# Patient Record
Sex: Male | Born: 1970 | Race: White | Hispanic: No | Marital: Married | Smoking: Never smoker
Health system: Southern US, Community
[De-identification: ages and names within clinical notes are randomized; demographics above are authoritative.]

## PROBLEM LIST (undated history)

## (undated) HISTORY — PX: DG THUMB RIGHT HAND (ARMC HX): HXRAD1825

## (undated) HISTORY — PX: KNEE ARTHROSCOPY W/ ACL RECONSTRUCTION: SHX1858

## (undated) HISTORY — PX: APPENDECTOMY: SHX54

## (undated) HISTORY — PX: BACK SURGERY: SHX140

## (undated) HISTORY — PX: OTHER SURGICAL HISTORY: SHX169

---

## 2003-05-14 ENCOUNTER — Emergency Department (HOSPITAL_COMMUNITY): Admission: EM | Admit: 2003-05-14 | Discharge: 2003-05-14 | Payer: Self-pay | Admitting: Emergency Medicine

## 2005-09-19 ENCOUNTER — Ambulatory Visit: Payer: Self-pay | Admitting: Internal Medicine

## 2007-08-11 ENCOUNTER — Encounter: Admission: RE | Admit: 2007-08-11 | Discharge: 2007-08-11 | Payer: Self-pay | Admitting: Neurosurgery

## 2014-07-20 HISTORY — PX: COLONOSCOPY: SHX174

## 2018-07-04 DIAGNOSIS — G629 Polyneuropathy, unspecified: Secondary | ICD-10-CM | POA: Insufficient documentation

## 2018-09-02 ENCOUNTER — Other Ambulatory Visit: Payer: Self-pay

## 2018-09-02 ENCOUNTER — Other Ambulatory Visit: Payer: Self-pay | Admitting: Internal Medicine

## 2018-09-02 DIAGNOSIS — Z20822 Contact with and (suspected) exposure to covid-19: Secondary | ICD-10-CM

## 2018-09-07 LAB — NOVEL CORONAVIRUS, NAA: SARS-CoV-2, NAA: NOT DETECTED

## 2018-11-29 DIAGNOSIS — M722 Plantar fascial fibromatosis: Secondary | ICD-10-CM | POA: Insufficient documentation

## 2019-02-12 DIAGNOSIS — G609 Hereditary and idiopathic neuropathy, unspecified: Secondary | ICD-10-CM | POA: Insufficient documentation

## 2019-07-17 ENCOUNTER — Encounter: Payer: Self-pay | Admitting: Family Medicine

## 2020-05-27 ENCOUNTER — Telehealth: Payer: Self-pay | Admitting: Internal Medicine

## 2020-05-27 NOTE — Telephone Encounter (Signed)
Good morning Dr. Hilarie Fredrickson,   We received records from following patient whom would like to be scheduled for a colonoscopy. I will sent the records for you to review. Please advise.   Thank you ,  Smitty Cords

## 2020-05-31 NOTE — Telephone Encounter (Signed)
I received records regarding a colonoscopy. I did not receive the official report but rather the "colonoscopy discharge instructions" that were probably given directly to the patient. I need to see the full colonoscopy report with an impression and plan before I can make a decision regarding repeating colonoscopy.  I am happy to perform the colonoscopy once I have reviewed the records completely to determine when in fact the procedure is due. Also please determine if the patient has a family history of colon cancer

## 2020-06-02 NOTE — Telephone Encounter (Signed)
Left voicemail to patient advising to give a call back to inform him of needing additional information.   Thank you

## 2020-06-08 NOTE — Telephone Encounter (Signed)
Hey Dr Hilarie Fredrickson, we received colonoscopy records, I will send to you for review

## 2020-06-14 NOTE — Telephone Encounter (Signed)
I received the colonoscopy report performed by Dr. Shirleen Schirmer on 07/20/2014 This showed moderate diverticulosis in the sigmoid and internal hemorrhoids  He recommended a repeat colonoscopy in 1 year with 2-day prep given prep quality and risk factors  Colonoscopy can be scheduled, 2-day bowel prep

## 2020-06-24 NOTE — Telephone Encounter (Signed)
Left voicemail with patient to give a call back to schedule appointment.

## 2020-07-19 ENCOUNTER — Other Ambulatory Visit: Payer: Self-pay

## 2020-07-19 ENCOUNTER — Ambulatory Visit (AMBULATORY_SURGERY_CENTER): Payer: BC Managed Care – PPO | Admitting: *Deleted

## 2020-07-19 VITALS — Ht 77.0 in | Wt 265.0 lb

## 2020-07-19 DIAGNOSIS — Z1211 Encounter for screening for malignant neoplasm of colon: Secondary | ICD-10-CM

## 2020-07-19 MED ORDER — NA SULFATE-K SULFATE-MG SULF 17.5-3.13-1.6 GM/177ML PO SOLN: ORAL | 0 refills | Status: DC

## 2020-07-19 NOTE — Progress Notes (Signed)

## 2020-07-23 ENCOUNTER — Encounter: Payer: Self-pay | Admitting: Internal Medicine

## 2020-08-03 ENCOUNTER — Ambulatory Visit (AMBULATORY_SURGERY_CENTER): Payer: BC Managed Care – PPO | Admitting: Internal Medicine

## 2020-08-03 ENCOUNTER — Encounter: Payer: Self-pay | Admitting: Internal Medicine

## 2020-08-03 ENCOUNTER — Other Ambulatory Visit: Payer: Self-pay

## 2020-08-03 VITALS — BP 141/93 | HR 61 | Temp 98.6°F | Resp 26 | Ht 77.0 in | Wt 265.0 lb

## 2020-08-03 DIAGNOSIS — Z1211 Encounter for screening for malignant neoplasm of colon: Secondary | ICD-10-CM | POA: Diagnosis not present

## 2020-08-03 DIAGNOSIS — K635 Polyp of colon: Secondary | ICD-10-CM

## 2020-08-03 DIAGNOSIS — D123 Benign neoplasm of transverse colon: Secondary | ICD-10-CM | POA: Diagnosis not present

## 2020-08-03 DIAGNOSIS — Z8371 Family history of colonic polyps: Secondary | ICD-10-CM

## 2020-08-03 DIAGNOSIS — Z800 Family history of malignant neoplasm of digestive organs: Secondary | ICD-10-CM

## 2020-08-03 DIAGNOSIS — D120 Benign neoplasm of cecum: Secondary | ICD-10-CM

## 2020-08-03 DIAGNOSIS — Z83719 Family history of colon polyps, unspecified: Secondary | ICD-10-CM

## 2020-08-03 MED ORDER — SODIUM CHLORIDE 0.9 % IV SOLN: 500.0000 mL | Freq: Once | INTRAVENOUS | Status: DC

## 2020-08-03 NOTE — Progress Notes (Signed)
Called to room to assist during endoscopic procedure.  Patient ID and intended procedure confirmed with present staff. Received instructions for my participation in the procedure from the performing physician.  

## 2020-08-03 NOTE — Patient Instructions (Signed)
Handouts on polyps, hemorrhoids, and diverticula given to you today  Await pathology results    YOU HAD AN ENDOSCOPIC PROCEDURE TODAY AT [facility]:   Refer to the procedure report that was given to you for any specific questions about what was found during the examination.  If the procedure report does not answer your questions, please call your gastroenterologist to clarify.  If you requested that your care partner not be given the details of your procedure findings, then the procedure report has been included in a sealed envelope for you to review at your convenience later.  YOU SHOULD EXPECT: Some feelings of bloating in the abdomen. Passage of more gas than usual.  Walking can help get rid of the air that was put into your GI tract during the procedure and reduce the bloating. If you had a lower endoscopy (such as a colonoscopy or flexible sigmoidoscopy) you may notice spotting of blood in your stool or on the toilet paper. If you underwent a bowel prep for your procedure, you may not have a normal bowel movement for a few days.  Please Note:  You might notice some irritation and congestion in your nose or some drainage.  This is from the oxygen used during your procedure.  There is no need for concern and it should clear up in a day or so.  SYMPTOMS TO REPORT IMMEDIATELY:   Following lower endoscopy (colonoscopy or flexible sigmoidoscopy):  Excessive amounts of blood in the stool  Significant tenderness or worsening of abdominal pains  Swelling of the abdomen that is new, acute  Fever of 100F or higher  For urgent or emergent issues, a gastroenterologist can be reached at any hour by calling [phone]. Do not use MyChart messaging for urgent concerns.    DIET:  We do recommend a small meal at first, but then you may proceed to your regular diet.  Drink plenty of fluids but you should avoid alcoholic beverages for 24 hours.  ACTIVITY:  You should plan to take it  easy for the rest of today and you should NOT DRIVE or use heavy machinery until tomorrow (because of the sedation medicines used during the test).    FOLLOW UP: Our staff will call the number listed on your records 48-72 hours following your procedure to check on you and address any questions or concerns that you may have regarding the information given to you following your procedure. If we do not reach you, we will leave a message.  We will attempt to reach you two times.  During this call, we will ask if you have developed any symptoms of COVID 19. If you develop any symptoms (ie: fever, flu-like symptoms, shortness of breath, cough etc.) before then, please call [phone].  If you test positive for Covid 19 in the 2 weeks post procedure, please call and report this information to [place].    If any biopsies were taken you will be contacted by phone or by letter within the next 1-3 weeks.  Please call us at [phone] if you have not heard about the biopsies in 3 weeks.    SIGNATURES/CONFIDENTIALITY: You and/or your care partner have signed paperwork which will be entered into your electronic medical record.  These signatures attest to the fact that that the information above on your After Visit Summary has been reviewed and is understood.  Full responsibility of the confidentiality of this discharge information lies with you and/or your care-partner.

## 2020-08-03 NOTE — Progress Notes (Signed)
pt tolerated well. VSS. awake and to recovery. Report given to RN.  

## 2020-08-03 NOTE — Op Note (Signed)
[facility] Patient Name: Philip Sherman Procedure Date: 08/03/2020 7:50 AM MRN: [id number] Endoscopist: Jerene Bears , MD Age: 50 Referring MD:  Date of Birth: [birth year] Gender: Male Account #: [id number] Procedure:                Colonoscopy Indications:              Colon cancer screening in patient at increased                            risk: Family history of colon polyps in multiple                            1st-degree relatives (brother and father), Colon                            cancer screening in patient at increased risk:                            Family history of colorectal cancer in multiple 2nd                            degree relatives (paternal grandmother and                            grandfather), Last colonoscopy: 2016 (less than                            adequate prep on that day) Medicines:                Monitored Anesthesia Care Procedure:                Pre-Anesthesia Assessment:                           - Prior to the procedure, a History and Physical                            was performed, and patient medications and                            allergies were reviewed. The patient's tolerance of                            previous anesthesia was also reviewed. The risks                            and benefits of the procedure and the sedation                            options and risks were discussed with the patient.                            All questions were answered, and informed consent  was obtained. Prior Anticoagulants: The patient has                            taken no previous anticoagulant or antiplatelet                            agents. ASA Grade Assessment: II - A patient with                            mild systemic disease. After reviewing the risks                            and benefits, the patient was deemed in                            satisfactory condition to undergo the  procedure.                           After obtaining informed consent, the colonoscope                            was passed under direct vision. Throughout the                            procedure, the patient's blood pressure, pulse, and                            oxygen saturations were monitored continuously. The                            Olympus CF-HQ190L ([id number]) Colonoscope was                            introduced through the anus and advanced to the                            cecum, identified by its appearance. The                            colonoscopy was performed without difficulty. The                            patient tolerated the procedure well. The quality                            of the bowel preparation was good. The ileocecal                            valve, appendiceal orifice, and rectum were                            photographed. The bowel preparation used was 2 day  MiraLax + Suprep via split dose instruction. Scope In: 8:11:35 AM Scope Out: 8:29:37 AM Scope Withdrawal Time: 0 hours 12 minutes 40 seconds  Total Procedure Duration: 0 hours 18 minutes 2 seconds  Findings:                 The digital rectal exam was normal.                           A 3 mm polyp was found in the cecum. The polyp was                            sessile. The polyp was removed with a cold snare.                            Resection and retrieval were complete.                           Two sessile polyps were found in the transverse                            colon. The polyps were 5 to 6 mm in size. These                            polyps were removed with a cold snare. Resection                            and retrieval were complete.                           Many small and large-mouthed diverticula were found                            from transverse colon to sigmoid colon.                           Internal hemorrhoids were found during                             retroflexion. The hemorrhoids were small. Complications:            No immediate complications. Estimated Blood Loss:     Estimated blood loss was minimal. Impression:               - One 3 mm polyp in the cecum, removed with a cold                            snare. Resected and retrieved.                           - Two 5 to 6 mm polyps in the transverse colon,                            removed with a cold snare. Resected and retrieved.                           -  Diverticulosis from transverse colon to sigmoid                            colon.                           - Small internal hemorrhoids. Recommendation:           - Patient has a contact number available for                            emergencies. The signs and symptoms of potential                            delayed complications were discussed with the                            patient. Return to normal activities tomorrow.                            Written discharge instructions were provided to the                            patient.                           - Resume previous diet.                           - Continue present medications.                           - Await pathology results.                           - Repeat colonoscopy is recommended for                            surveillance. The colonoscopy date will be                            determined after pathology results from today's                            exam become available for review. Jerene Bears, MD 08/03/2020 8:34:02 AM This report has been signed electronically.

## 2020-08-03 NOTE — Progress Notes (Signed)
Vital signs checked by:JK  The patient states no changes in medical or surgical history since pre-visit screening on  07/19/20.

## 2020-08-04 ENCOUNTER — Encounter: Payer: Self-pay | Admitting: Internal Medicine

## 2020-08-05 ENCOUNTER — Telehealth: Payer: Self-pay

## 2020-08-05 ENCOUNTER — Telehealth: Payer: Self-pay | Admitting: *Deleted

## 2020-08-05 NOTE — Telephone Encounter (Signed)
First follow up call attempt.  LVM. 

## 2020-08-05 NOTE — Telephone Encounter (Signed)
Attempted to reach pt. With follow-up call following endoscopic procedure 08/03/2020.  LM on pt. Voice mail to call if he has any questions or concerns.

## 2020-08-07 ENCOUNTER — Encounter: Payer: Self-pay | Admitting: Internal Medicine

## 2020-08-18 ENCOUNTER — Other Ambulatory Visit: Payer: Self-pay | Admitting: Orthopedic Surgery

## 2020-08-18 DIAGNOSIS — M5416 Radiculopathy, lumbar region: Secondary | ICD-10-CM

## 2020-08-18 DIAGNOSIS — M4726 Other spondylosis with radiculopathy, lumbar region: Secondary | ICD-10-CM

## 2020-08-18 DIAGNOSIS — M48061 Spinal stenosis, lumbar region without neurogenic claudication: Secondary | ICD-10-CM

## 2020-08-18 DIAGNOSIS — Z981 Arthrodesis status: Secondary | ICD-10-CM

## 2020-08-18 DIAGNOSIS — M5136 Other intervertebral disc degeneration, lumbar region: Secondary | ICD-10-CM

## 2020-08-18 DIAGNOSIS — M532X6 Spinal instabilities, lumbar region: Secondary | ICD-10-CM

## 2020-08-18 DIAGNOSIS — M5386 Other specified dorsopathies, lumbar region: Secondary | ICD-10-CM

## 2020-08-18 DIAGNOSIS — M4807 Spinal stenosis, lumbosacral region: Secondary | ICD-10-CM

## 2020-09-07 ENCOUNTER — Ambulatory Visit
Admission: RE | Admit: 2020-09-07 | Discharge: 2020-09-07 | Disposition: A | Discharge status: Home / Self Care | Payer: Worker's Compensation | Source: Ambulatory Visit | Attending: Orthopedic Surgery | Admitting: Orthopedic Surgery

## 2020-09-07 DIAGNOSIS — M532X6 Spinal instabilities, lumbar region: Secondary | ICD-10-CM

## 2020-09-07 DIAGNOSIS — Z981 Arthrodesis status: Secondary | ICD-10-CM

## 2020-09-07 DIAGNOSIS — M4807 Spinal stenosis, lumbosacral region: Secondary | ICD-10-CM

## 2020-09-07 DIAGNOSIS — M5416 Radiculopathy, lumbar region: Secondary | ICD-10-CM

## 2020-09-07 DIAGNOSIS — M5386 Other specified dorsopathies, lumbar region: Secondary | ICD-10-CM

## 2020-09-07 DIAGNOSIS — M4726 Other spondylosis with radiculopathy, lumbar region: Secondary | ICD-10-CM

## 2020-09-07 DIAGNOSIS — M51369 Other intervertebral disc degeneration, lumbar region without mention of lumbar back pain or lower extremity pain: Secondary | ICD-10-CM

## 2020-09-07 DIAGNOSIS — M5136 Other intervertebral disc degeneration, lumbar region: Secondary | ICD-10-CM

## 2020-09-07 DIAGNOSIS — M48061 Spinal stenosis, lumbar region without neurogenic claudication: Secondary | ICD-10-CM

## 2020-09-07 MED ORDER — GADOBENATE DIMEGLUMINE 529 MG/ML IV SOLN
20.0000 mL | Freq: Once | INTRAVENOUS | Status: AC | PRN
  Administered 2020-09-07: 20 mL via INTRAVENOUS

## 2020-11-26 DIAGNOSIS — M51369 Other intervertebral disc degeneration, lumbar region without mention of lumbar back pain or lower extremity pain: Secondary | ICD-10-CM | POA: Insufficient documentation

## 2021-01-01 DIAGNOSIS — E785 Hyperlipidemia, unspecified: Secondary | ICD-10-CM | POA: Insufficient documentation

## 2022-02-06 DIAGNOSIS — Y9361 Activity, american tackle football: Secondary | ICD-10-CM | POA: Insufficient documentation

## 2022-02-06 DIAGNOSIS — Z87820 Personal history of traumatic brain injury: Secondary | ICD-10-CM | POA: Insufficient documentation

## 2022-02-09 DIAGNOSIS — G2581 Restless legs syndrome: Secondary | ICD-10-CM | POA: Insufficient documentation

## 2022-02-09 DIAGNOSIS — R030 Elevated blood-pressure reading, without diagnosis of hypertension: Secondary | ICD-10-CM | POA: Insufficient documentation

## 2022-02-09 DIAGNOSIS — R7989 Other specified abnormal findings of blood chemistry: Secondary | ICD-10-CM | POA: Insufficient documentation

## 2022-02-09 DIAGNOSIS — I2510 Atherosclerotic heart disease of native coronary artery without angina pectoris: Secondary | ICD-10-CM | POA: Insufficient documentation

## 2022-02-09 DIAGNOSIS — F411 Generalized anxiety disorder: Secondary | ICD-10-CM | POA: Insufficient documentation

## 2022-02-09 DIAGNOSIS — R768 Other specified abnormal immunological findings in serum: Secondary | ICD-10-CM | POA: Insufficient documentation

## 2022-02-16 DIAGNOSIS — I10 Essential (primary) hypertension: Secondary | ICD-10-CM | POA: Insufficient documentation

## 2022-05-12 IMAGING — MR MR LUMBAR SPINE WO/W CM
4 of 8 series · 26 of 48 positions shown · IV contrast (multihance)
Comparison: 08/11/2007

CLINICAL DATA: Toe numbness.  Lumbar fusion in 4761

EXAM:
MRI LUMBAR SPINE WITHOUT AND WITH CONTRAST
TECHNIQUE: Multiplanar and multiecho pulse sequences of the lumbar spine were
obtained without and with intravenous contrast.
CONTRAST:  20mL MULTIHANCE GADOBENATE DIMEGLUMINE 529 MG/ML IV SOLN

[Series 4: T2 · sagittal · 4.0mm · 0.57mm/px · 5 of 16 slices shown (1 of 2)]
[im 1/16]
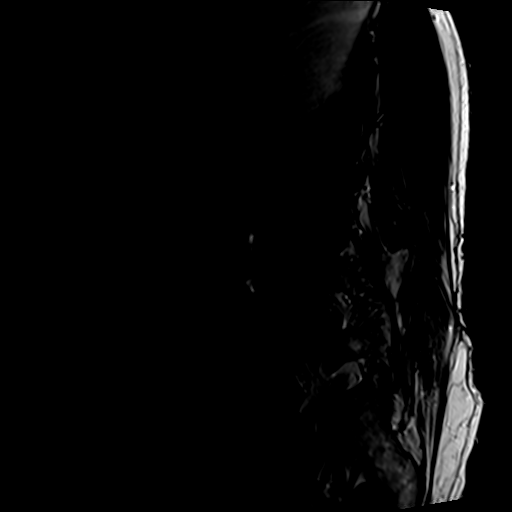
[im 4/16]
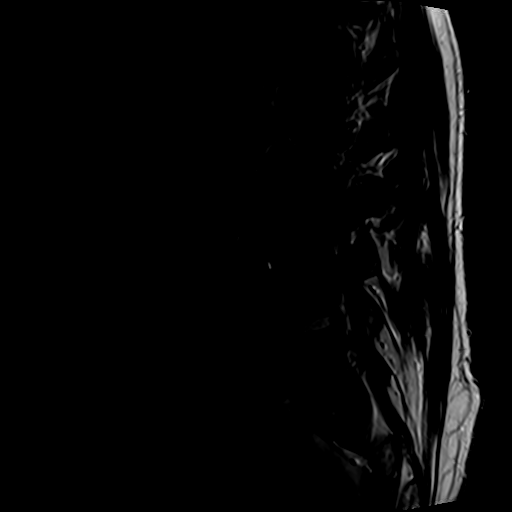
[im 8/16]
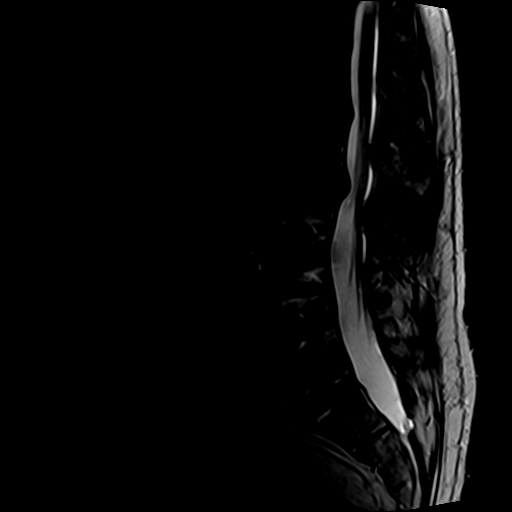
[im 12/16]
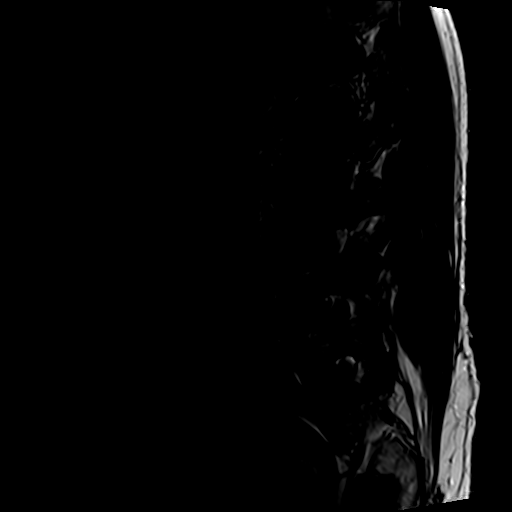
[im 16/16]
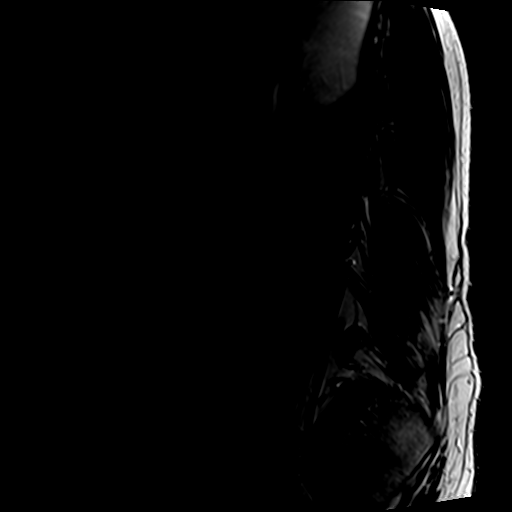

[Series 6: T1 · sagittal · 4.0mm · 0.57mm/px · 4 of 16 slices shown (1 of 2)]
[im 1/16]
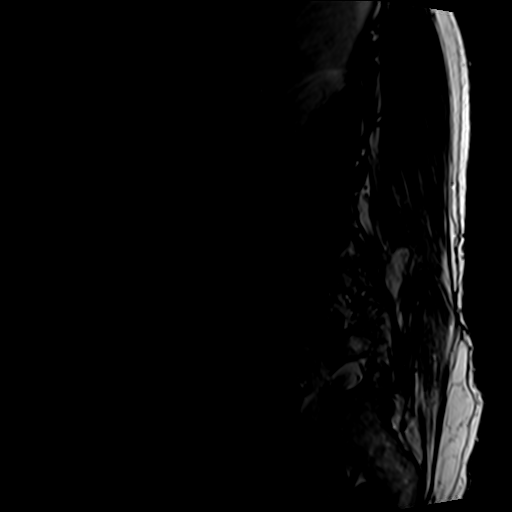
[im 6/16]
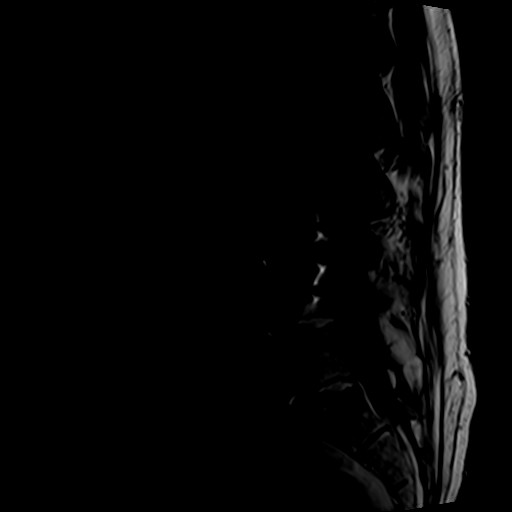
[im 11/16]
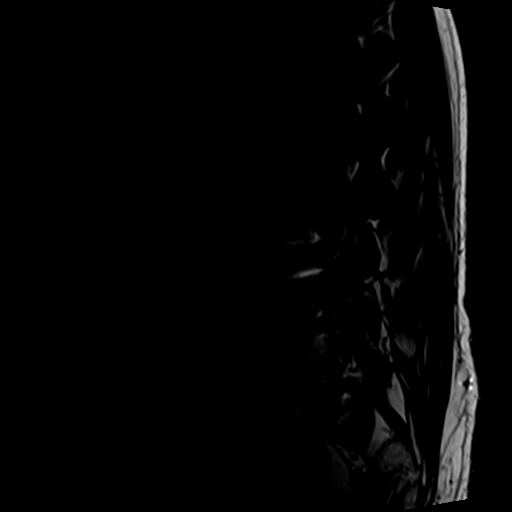
[im 16/16]
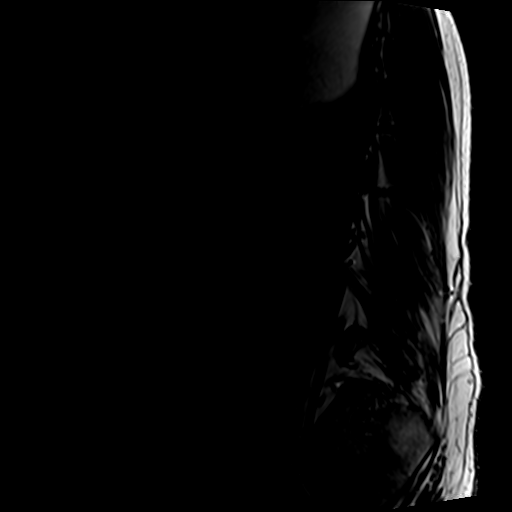

[Series 7: T2 · axial · 4.0mm · 0.70mm/px · z∈[+10,+220]mm · 9 of 35 slices shown (2 of 2)]
[im 1/35]
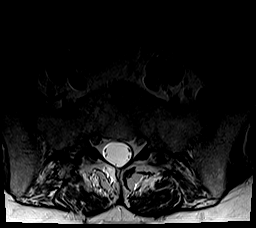
[im 5/35]
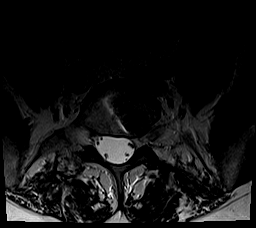
[im 9/35]
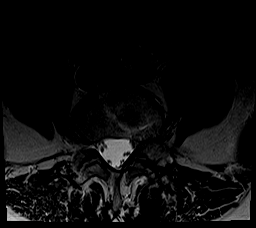
[im 13/35]
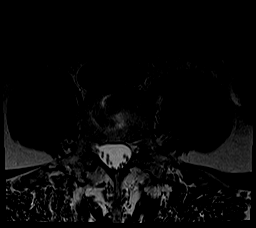
[im 18/35]
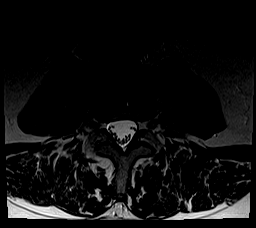
[im 22/35]
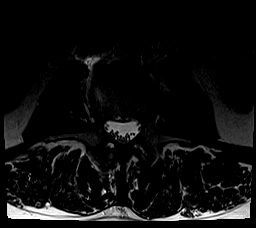
[im 26/35]
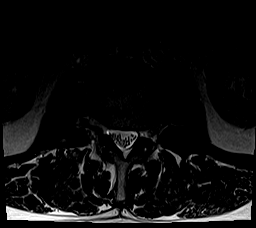
[im 30/35]
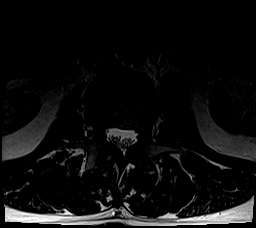
[im 35/35]
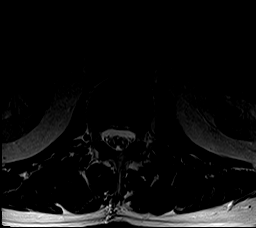

[Series 8: T1 · axial · 4.0mm · 0.35mm/px · z∈[+10,+194]mm · 8 of 35 slices shown (2 of 2)]
[im 1/35]
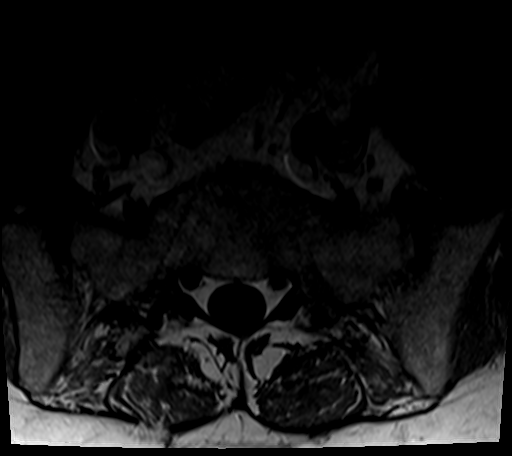
[im 5/35]
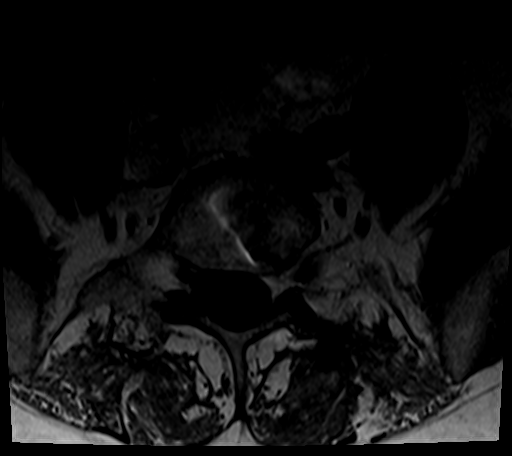
[im 9/35]
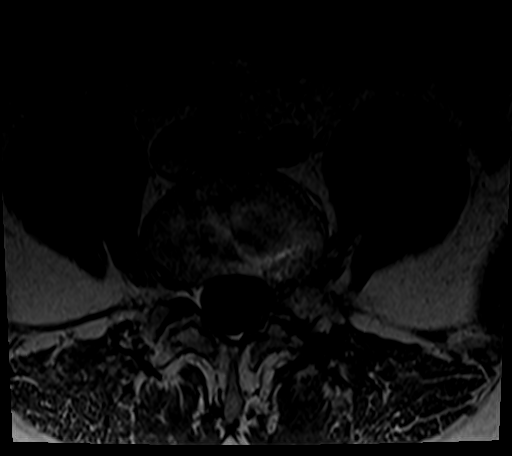
[im 13/35]
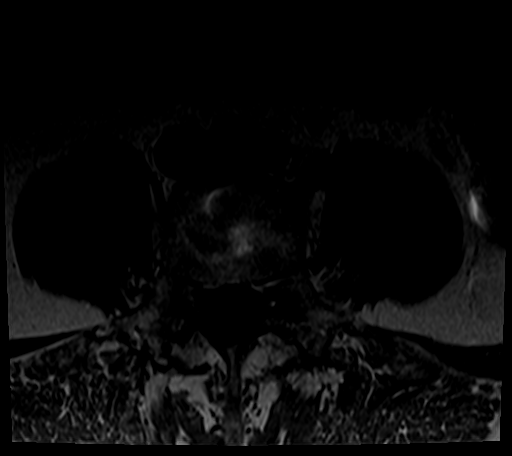
[im 18/35]
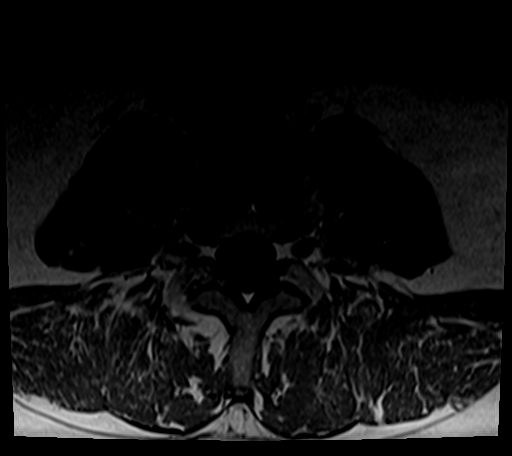
[im 22/35]
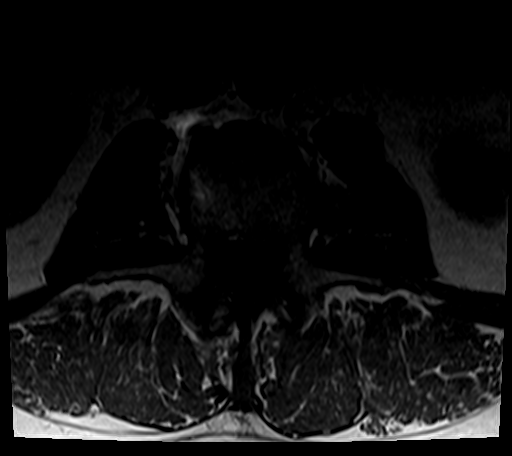
[im 26/35]
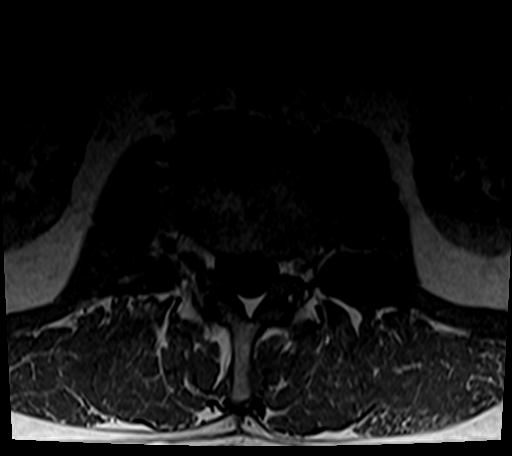
[im 30/35]
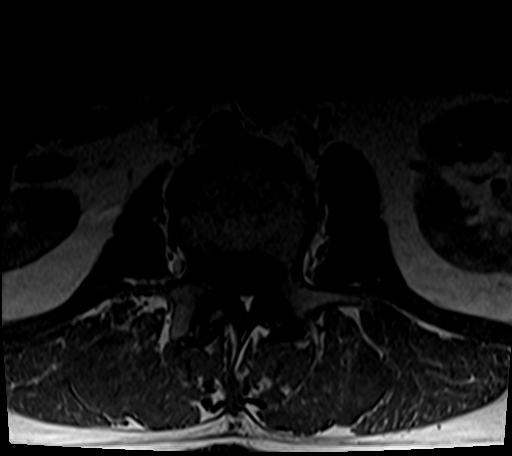

[26 of 48 positions shown; findings below may reference images not displayed]

FINDINGS: Segmentation:  Normal

Alignment:  Grade 1 retrolisthesis at L2-3, progressed.

Vertebrae: L3-4 ALIF. Interbody spacers at L4-5 and L5-S1. No acute
fracture. No focal osseous lesion.

Conus medullaris and cauda equina: Conus extends to the L1 level.
Conus and cauda equina appear normal.

Paraspinal and other soft tissues: Negative

Disc levels:

L1-L2: Normal disc space and facet joints. No spinal canal stenosis.
No neural foraminal stenosis.

L2-L3: Small disc bulge with endplate spurring, new. No spinal canal
stenosis. No neural foraminal stenosis.

L3-L4: ALIF. No spinal canal stenosis. No neural foraminal stenosis.

L4-L5: Interbody disc spacer. Normal facets. No spinal canal
stenosis. Left foraminal endplate spurring with mild left neural
foraminal stenosis.

L5-S1: Interbody spacer. Progression of moderate right and severe
left facet hypertrophy. No spinal canal stenosis. No neural
foraminal stenosis.

Visualized sacrum: Normal.
IMPRESSION: 1. Progression of facet arthrosis at L5-S1, which may be a source of
local low back pain.
2. L3-4 ALIF without spinal canal stenosis.
3. Mild left neural foraminal stenosis at L4-5.

## 2022-07-04 DIAGNOSIS — E669 Obesity, unspecified: Secondary | ICD-10-CM | POA: Insufficient documentation

## 2022-08-21 ENCOUNTER — Telehealth: Payer: Self-pay | Admitting: Family Medicine

## 2022-08-21 NOTE — Telephone Encounter (Signed)
Patient would like to establish care with Dr.Greene. States he has already talked to the provider and the provider made him aware to call the office to get consent. Please advise.

## 2022-08-21 NOTE — Telephone Encounter (Signed)
Please schedule patient.

## 2022-08-21 NOTE — Telephone Encounter (Signed)
Yes, okay to establish new patient appointment with me.  Happy to see him.  Thanks.

## 2022-08-25 ENCOUNTER — Encounter: Payer: Self-pay | Admitting: Family Medicine

## 2022-08-25 ENCOUNTER — Ambulatory Visit: Payer: BC Managed Care – PPO | Admitting: Family Medicine

## 2022-08-25 VITALS — BP 118/60 | HR 65 | Temp 97.8°F | Ht 77.0 in | Wt 260.0 lb

## 2022-08-25 DIAGNOSIS — Z131 Encounter for screening for diabetes mellitus: Secondary | ICD-10-CM | POA: Diagnosis not present

## 2022-08-25 DIAGNOSIS — G4700 Insomnia, unspecified: Secondary | ICD-10-CM

## 2022-08-25 DIAGNOSIS — Z23 Encounter for immunization: Secondary | ICD-10-CM

## 2022-08-25 DIAGNOSIS — E785 Hyperlipidemia, unspecified: Secondary | ICD-10-CM | POA: Diagnosis not present

## 2022-08-25 DIAGNOSIS — E291 Testicular hypofunction: Secondary | ICD-10-CM

## 2022-08-25 DIAGNOSIS — G629 Polyneuropathy, unspecified: Secondary | ICD-10-CM

## 2022-08-25 DIAGNOSIS — I10 Essential (primary) hypertension: Secondary | ICD-10-CM | POA: Diagnosis not present

## 2022-08-25 LAB — COMPREHENSIVE METABOLIC PANEL
ALT: 24 U/L (ref 0–53)
AST: 18 U/L (ref 0–37)
Albumin: 4.5 g/dL (ref 3.5–5.2)
Alkaline Phosphatase: 54 U/L (ref 39–117)
BUN: 25 mg/dL — ABNORMAL HIGH (ref 6–23)
CO2: 30 mEq/L (ref 19–32)
Calcium: 9.9 mg/dL (ref 8.4–10.5)
Chloride: 103 mEq/L (ref 96–112)
Creatinine, Ser: 0.92 mg/dL (ref 0.40–1.50)
GFR: 95.2 mL/min (ref >60.00)
Glucose, Bld: 77 mg/dL (ref 70–99)
Potassium: 5.1 mEq/L (ref 3.5–5.1)
Sodium: 141 mEq/L (ref 135–145)
Total Bilirubin: 0.7 mg/dL (ref 0.2–1.2)
Total Protein: 7.1 g/dL (ref 6.0–8.3)

## 2022-08-25 LAB — HEMOGLOBIN A1C: Hgb A1c MFr Bld: 5.7 % (ref 4.6–6.5)

## 2022-08-25 LAB — LIPID PANEL
Cholesterol: 141 mg/dL (ref 0–200)
HDL: 35.2 mg/dL — ABNORMAL LOW (ref >39.00)
LDL Cholesterol: 75 mg/dL (ref 0–99)
NonHDL: 105.99
Total CHOL/HDL Ratio: 4
Triglycerides: 153 mg/dL — ABNORMAL HIGH (ref 0.0–149.0)
VLDL: 30.6 mg/dL (ref 0.0–40.0)

## 2022-08-25 LAB — TESTOSTERONE: Testosterone: 169.98 ng/dL — ABNORMAL LOW (ref 300.00–890.00)

## 2022-08-25 LAB — CBC
HCT: 45.2 % (ref 39.0–52.0)
Hemoglobin: 15.1 g/dL (ref 13.0–17.0)
MCHC: 33.5 g/dL (ref 30.0–36.0)
MCV: 91.7 fl (ref 78.0–100.0)
Platelets: 239 10*3/uL (ref 150.0–400.0)
RBC: 4.93 Mil/uL (ref 4.22–5.81)
RDW: 12.9 % (ref 11.5–15.5)
WBC: 7.1 10*3/uL (ref 4.0–10.5)

## 2022-08-25 LAB — PSA: PSA: 0.51 ng/mL (ref 0.10–4.00)

## 2022-08-25 MED ORDER — ZOLPIDEM TARTRATE ER 6.25 MG PO TBCR
6.2500 mg | EXTENDED_RELEASE_TABLET | Freq: Every evening | ORAL | 0 refills | Status: DC | PRN
Start: 2022-08-25 — End: 2023-05-03

## 2022-08-25 NOTE — Progress Notes (Signed)
Subjective:  Patient ID: Philip Sherman, male    DOB: 06/25/1970  Age: 52 y.o. MRN: [id number]  CC:  Chief Complaint  Patient presents with   Establish Care    Pt here to establish care, notes he has a lot going on, was getting ready to start testosterone treatment before he changed physicians,     HPI Philip Sherman presents for  Philip Sherman is here for transfer of care, new patient to establish care.  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 Previously under the care of Dr. Cyndia Bent at [facility] - he has been promoted to more administrative role. Family may also transfer to me. Son Philip Sherman and Philip Sherman - both in [place]. Daughter graduated from [place], other dtr in doctorate program at [employer].    Hypertension, hyperlipidemia, history of abnormal EKG Cardiologist, Dr. Fransisco Beau, last visit May 7, history of abnormal EKG, hyperlipidemia, hypertension.  Initial EKG had abnormal progression of precordial R wave.  Stress echocardiogram in February with exercise for 10 minutes, 12.8 METS without ischemic changes.  Stress echo with appropriate reduction in LV cavity size with augmentation of LV cavity function, no regional wall motion abnormality.  Blood pressure and cholesterol were well-controlled and no cardiovascular symptoms.  Continued on Crestor 10 mg daily for hyperlipidemia, 10 mg lisinopril for hypertension. No new side effects  Father with heart disease, CABG - MI at early age - around 53.   BP Readings from Last 3 Encounters:  08/25/22 118/60  08/03/20 (!) 141/93  No results found for: "CHOL", "HDL", "LDLCALC", "LDLDIRECT", "TRIG", "CHOLHDL" Chart review, the panel 02/08/2022, total 172, HDL 42, LDL 102, triglycerides 142 Exercise limited due to foot issues.  Fasting today except  coffee with creamer.   Has taken [facility] for weight loss - no new side effects. Some decreased appetite. No n/v/abd pain.  Body mass index is 30.83 kg/m. Wt Readings from Last 3 Encounters:  08/25/22 260 lb (117.9 kg)  08/03/20 265 lb (120.2 kg)  07/19/20 265 lb (120.2 kg)  A1c 5.5 on 02/08/22.   Insomnia: History of insomnia treated with Ambien CR 6.25 mg, only as needed. Once every few weeks.Controlled substance database (PDMP) reviewed. No concerns appreciated.  Last filled #30, 11/09/21.   Neuropathy Ongoing issue, followed previously by [facility] Dr. Rulon Eisenmenger.  Note reviewed from January 26 -at that time had skin biopsy, diagnosed with small fiber sensory neuropathy.  treated with gabapentin 600 mg nightly -dosage increased on April 11.  Referred to [facility] neuropathy clinic.  Note reviewed from April 15 -E consult.  New EMG to evaluate for possible large fiber involvement.  Labs obtained including hepatology evaluation, with history of positive HCV antibody - HCV RNA not detected in 01/2022. 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  Option of foot orthotics.  Plan for a second opinion from [facility] soon - Working with [employer] on this as well - will be seeing in November, possible [facility] and will be seeing provider in [place]. Still on gabapentin 600mg  at night. No new side effects.    Hypogonadism Recently evaluated by his previous PCP.  Testosterone level of 138 at 7:46 AM on June 11th.  previously 148 with free testosterone 4.3 on April  24, drawn at 3:18 PM.  No current testosterone supplementation. Low energy levels. Multiple low testosterone levels prior - plan to start supplement at last pcp, but deferred due to new pcp plan. Labs today with follow up to discuss options in next few weeks.   HM:  Shingrix today.   History There are no problems to display for this patient.  History  reviewed. No pertinent past medical history. Past Surgical History:  Procedure Laterality Date   APPENDECTOMY     BACK SURGERY     x3   COLONOSCOPY  07/20/2014   Dr.Gillis    DG THUMB RIGHT HAND ([facility] HX)     index finger     KNEE ARTHROSCOPY W/ ACL RECONSTRUCTION     No Known Allergies Prior to Admission medications   Medication Sig Start Date End Date Taking? Authorizing Provider  gabapentin (NEURONTIN) 300 MG capsule Take 600 mg by mouth at bedtime. 02/28/22  Yes [provider]  lisinopril (ZESTRIL) 10 MG tablet Take 1 tablet by mouth daily. 02/10/22  Yes [provider]  Multiple Vitamin (MULTIVITAMIN) tablet Take 1 tablet by mouth daily.   Yes [provider]  rosuvastatin (CRESTOR) 10 MG tablet Take 10 mg by mouth daily. 11/09/21  Yes [provider]  zolpidem (AMBIEN CR) 6.25 MG CR tablet Take 6.25 mg by mouth at bedtime as needed. 05/24/20  Yes [provider]   Social History   Socioeconomic History   Marital status: Married    Spouse name: Not on file   Number of children: Not on file   Years of education: Not on file   Highest education level: Not on file  Occupational History   Not on file  Tobacco Use   Smoking status: Never   Smokeless tobacco: Never  Vaping Use   Vaping Use: Never used  Substance and Sexual Activity   Alcohol use: Yes    Alcohol/week: 4.0 standard drinks of alcohol    Types: 4 Standard drinks or equivalent per week   Drug use: Not Currently   Sexual activity: Yes  Other Topics Concern   Not on file  Social History Narrative   ** Merged History Encounter **       Social Determinants of Health   Financial Resource Strain: Not on file  Food Insecurity: Not on file  Transportation Needs: Not on file  Physical Activity: Not on file  Stress: Not on file  Social Connections: Not on file  Intimate Partner Violence: Not on file    Review of Systems Per HPI.   Objective:   Vitals:    08/25/22 1056  BP: 118/60  Pulse: 65  Temp: 97.8 F (36.6 C)  TempSrc: Temporal  SpO2: 95%  Weight: 260 lb (117.9 kg)  Height: 6\' 5"  (1.956 m)     Physical Exam Vitals reviewed.  Constitutional:      Appearance: He is well-developed.  HENT:     Head: Normocephalic and atraumatic.  Neck:     Vascular: No carotid bruit or JVD.  Cardiovascular:     Rate and Rhythm: Normal rate and regular rhythm.     Heart sounds: Normal heart sounds. No murmur heard. Pulmonary:     Effort: Pulmonary effort is normal.     Breath sounds: Normal breath sounds. No rales.  Musculoskeletal:     Right lower leg: No edema.     Left lower leg: No edema.  Skin:    General: Skin is warm and dry.  Neurological:     Mental Status: He is alert and oriented to person, place, and time.  Psychiatric:        Mood and Affect: Mood normal.        Assessment & Plan:  Philip Sherman is a 52 y.o. male . Essential hypertension - Plan: Comprehensive metabolic panel  -Controlled on lisinopril, continue same.  Check updated labs.  No med changes for now.  Recent cardiology eval noted as above.  Small fiber neuropathy  -Unfortunately persistent symptoms, managed by local neurologist and plans to have other evaluations through provider in [place], possibly at [facility] or [facility] as well.  Continued on gabapentin 600 mg every afternoon for now, changes per neuro.  Can discuss plan further at follow-up in 1 month.  Hyperlipidemia, unspecified hyperlipidemia type - Plan: Comprehensive metabolic panel, Lipid panel  -Tolerating Crestor, updated labs ordered.  Family history of early cardiac disease, ideally would like to see LDL below 70.  Hypogonadism male - Plan: Comprehensive metabolic panel, Testosterone, CBC, PSA  -Multiple low testosterone levels, repeat level today along with CBC, PSA, CMET, lipid panel for monitoring.  Depending on level consider start of testosterone supplementation, will  discuss various options at his follow-up visit.  If significantly low testosterone consider urology eval, or endocrinology eval including for possible prolactin. LH, FSH?  Insomnia, unspecified type - Plan: zolpidem (AMBIEN CR) 6.25 MG CR tablet  -controlled on intermittent use, refilled, no concerns on controlled substance database.  Need for shingles vaccine - Plan: Varicella-zoster vaccine IM  Screening for diabetes mellitus - Plan: Hemoglobin A1c  -Has been treated with [facility] for weight loss, previous A1c looked okay.  Check updated readings.  BMI of 30.8 today.  Meds ordered this encounter  Medications   zolpidem (AMBIEN CR) 6.25 MG CR tablet    Sig: Take 1 tablet (6.25 mg total) by mouth at bedtime as needed.    Dispense:  30 tablet    Refill:  0   Patient Instructions  History comes in today.  I will check some labs as we discussed.  Can follow-up to discuss testosterone treatment options if levels are still low but I did also check monitoring labs today.  No other med changes at this time.  I did refill the Ambien.  Follow-up in 1 month and we can discuss plan further for the neuropathy but I am happy to see you sooner if any new concerns.  Take care.     Signed,   Meredith Staggers, MD  Primary Care, [facility] 08/25/22 12:51 PM

## 2022-08-25 NOTE — Patient Instructions (Signed)
History comes in today.  I will check some labs as we discussed.  Can follow-up to discuss testosterone treatment options if levels are still low but I did also check monitoring labs today.  No other med changes at this time.  I did refill the Ambien.  Follow-up in 1 month and we can discuss plan further for the neuropathy but I am happy to see you sooner if any new concerns.  Take care.

## 2022-08-26 ENCOUNTER — Encounter: Payer: Self-pay | Admitting: Family Medicine

## 2022-09-25 ENCOUNTER — Encounter: Payer: Self-pay | Admitting: Family Medicine

## 2022-09-25 ENCOUNTER — Ambulatory Visit: Payer: BC Managed Care – PPO | Admitting: Family Medicine

## 2022-09-25 VITALS — BP 122/68 | HR 60 | Temp 98.2°F | Ht 77.0 in | Wt 264.4 lb

## 2022-09-25 DIAGNOSIS — G629 Polyneuropathy, unspecified: Secondary | ICD-10-CM | POA: Diagnosis not present

## 2022-09-25 DIAGNOSIS — E291 Testicular hypofunction: Secondary | ICD-10-CM

## 2022-09-25 DIAGNOSIS — E785 Hyperlipidemia, unspecified: Secondary | ICD-10-CM | POA: Diagnosis not present

## 2022-09-25 NOTE — Progress Notes (Signed)
Subjective:  Patient ID: Philip Sherman, male    DOB: [birth year]  Age: 53 y.o. MRN: [id number]  CC:  Chief Complaint  Patient presents with   Results   Peripheral Neuropathy    HPI Philip Sherman presents for  Follow-up with labs, plan.  Initial establish care visit on 08/25/2022  Hyperlipidemia: With family history of cardiac disease, LDL borderline at 75 on most recent testing.  Continue treatment with Crestor 10 mg,  not completely fasting at that visit with borderline elevated triglycerides.  Lab Results  Component Value Date   CHOL 141 08/25/2022   HDL 35.20 (L) 08/25/2022   LDLCALC 75 08/25/2022   TRIG 153.0 (H) 08/25/2022   CHOLHDL 4 08/25/2022   Lab Results  Component Value Date   ALT 24 08/25/2022   AST 18 08/25/2022   ALKPHOS 54 08/25/2022   BILITOT 0.7 08/25/2022   Hypogonadism: Previously low readings at prior PCP.  Repeat testing low at 169 1 month ago. Drawn at 1159am.   Small fiber neuropathy See prior notes, persistent symptoms, managed by local neurologist and plan for other evaluations through provider in [place], [employer] or [facility] coordinating care through the [facility].  He is on gabapentin 300mg   - now taking BID.  Second opinion declined at [facility].  Saw [facility] mgt provider - nothing to add. Continued same mgt under care of Dr. Rulon Eisenmenger in [facility]. She did note has options of dosing increases. Had been on 600mg   once per day in afternoon, now on BID dosing - past 2 weeks. No new side effects, still nighttime symptoms with insomnia.  November 9th - Dr. Lurlean Leyden at [facility] - small fiber neuropathy specialist.    History There are no problems to display for this patient.  No past medical history on file. Past Surgical History:  Procedure Laterality Date   APPENDECTOMY     BACK SURGERY     x3   COLONOSCOPY  07/20/2014   Dr.Gillis    DG THUMB RIGHT HAND ([facility] HX)     index finger     KNEE ARTHROSCOPY W/ ACL  RECONSTRUCTION     No Known Allergies Prior to Admission medications   Medication Sig Start Date End Date Taking? Authorizing Provider  gabapentin (NEURONTIN) 300 MG capsule Take 600 mg by mouth at bedtime. 02/28/22  Yes [provider]  lisinopril (ZESTRIL) 10 MG tablet Take 1 tablet by mouth daily. 02/10/22  Yes [provider]  Multiple Vitamin (MULTIVITAMIN) tablet Take 1 tablet by mouth daily.   Yes [provider]  rosuvastatin (CRESTOR) 10 MG tablet Take 10 mg by mouth daily. 11/09/21  Yes [provider]  zolpidem (AMBIEN CR) 6.25 MG CR tablet Take 1 tablet (6.25 mg total) by mouth at bedtime as needed. 08/25/22  Yes Shade Flood, MD   Social History   Socioeconomic History   Marital status: Married    Spouse name: Not on file   Number of children: Not on file   Years of education: Not on file   Highest education level: Bachelor's degree (e.g., BA, AB, BS)  Occupational History   Not on file  Tobacco Use   Smoking status: Never   Smokeless tobacco: Never  Vaping Use   Vaping status: Never Used  Substance and Sexual Activity   Alcohol use: Yes    Alcohol/week: 4.0 standard drinks of alcohol    Types: 4 Standard drinks or equivalent per week  Drug use: Not Currently   Sexual activity: Yes  Other Topics Concern   Not on file  Social History Narrative   ** Merged History Encounter **       Social Determinants of Health   Financial Resource Strain: Low Risk  (09/24/2022)   Overall Financial Resource Strain (CARDIA)    Difficulty of Paying Living Expenses: Not hard at all  Food Insecurity: No Food Insecurity (09/24/2022)   Hunger Vital Sign    Worried About Running Out of Food in the Last Year: Never true    Ran Out of Food in the Last Year: Never true  Transportation Needs: No Transportation Needs (09/24/2022)   PRAPARE - Administrator, Civil Service (Medical): No    Lack of Transportation (Non-Medical): No  Physical  Activity: Unknown (09/24/2022)   Exercise Vital Sign    Days of Exercise per Week: 0 days    Minutes of Exercise per Session: Not on file  Stress: No Stress Concern Present (09/24/2022)   [employer] of Occupational Health - Occupational Stress Questionnaire    Feeling of Stress : Only a little  Social Connections: Moderately Integrated (09/24/2022)   Social Connection and Isolation Panel [NHANES]    Frequency of Communication with Friends and Family: More than three times a week    Frequency of Social Gatherings with Friends and Family: More than three times a week    Attends Religious Services: More than 4 times per year    Active Member of [employer] or Organizations: No    Attends Engineer, structural: Not on file    Marital Status: Married  Catering manager Violence: Not At Risk (03/02/2022)   Received from [employer], [facility]   HITS    Over the last 12 months how often did your partner physically hurt you?: 1    Over the last 12 months how often did your partner insult you or talk down to you?: 1    Over the last 12 months how often did your partner threaten you with physical harm?: 1    Over the last 12 months how often did your partner scream or curse at you?: 1    Review of Systems   Objective:   Vitals:   09/25/22 0915  BP: 122/68  Pulse: 60  Temp: 98.2 F (36.8 C)  TempSrc: Temporal  SpO2: 96%  Weight: 264 lb 6.4 oz (119.9 kg)  Height: 6\' 5"  (1.956 m)     Physical Exam Vitals reviewed.  Constitutional:      Appearance: He is well-developed.  HENT:     Head: Normocephalic and atraumatic.  Neck:     Vascular: No carotid bruit or JVD.  Cardiovascular:     Rate and Rhythm: Normal rate and regular rhythm.     Heart sounds: Normal heart sounds. No murmur heard. Pulmonary:     Effort: Pulmonary effort is normal.     Breath sounds: Normal breath sounds. No rales.  Musculoskeletal:     Right lower leg: No edema.     Left lower leg: No edema.   Skin:    General: Skin is warm and dry.  Neurological:     Mental Status: He is alert and oriented to person, place, and time.  Psychiatric:        Mood and Affect: Mood normal.        Assessment & Plan:  Philip Sherman is a 52 y.o. male . Small fiber neuropathy  -  Option of additional dose of gabapentin at night, and if tolerated could increase morning dosing after 1 week.  Option to 3 times daily dosing, but asked that he review these recommendations with his neurologist.  Follow-up with other specialist as planned above.  Hyperlipidemia, unspecified hyperlipidemia type  -Borderline control but not completely fasting with last labs, continue Crestor same dose for now.  Hypogonadism male - Plan: Testosterone, Free, Total, SHBG, Prolactin  -Repeat testosterone as morning lab draw, along with free, and SHBG readings.  Check prolactin level.  Depending on results consider supplementation versus urology or endocrinology eval.    No orders of the defined types were placed in this encounter.  Patient Instructions  You can try 2 gabapentin at night if ok with your neurologist, stay on 1 in the morning for now. We do have the option of higher dosing and more frequent intervals if needed. Keep me posted.  Depending on labs, we can discuss med or urology eval if needed for low testosterone.   Let me know if there are questions and take care.       Signed,   Meredith Staggers, MD [facility], [facility] 09/25/22 9:40 AM

## 2022-09-25 NOTE — Patient Instructions (Addendum)
You can try 2 gabapentin at night if ok with your neurologist, stay on 1 in the morning for now. We do have the option of higher dosing and more frequent intervals if needed. Keep me posted.  Depending on labs, we can discuss med or urology eval if needed for low testosterone.   Let me know if there are questions and take care.

## 2022-10-08 ENCOUNTER — Encounter: Payer: Self-pay | Admitting: Family Medicine

## 2022-10-09 ENCOUNTER — Telehealth: Payer: Self-pay | Admitting: Family Medicine

## 2022-10-09 NOTE — Addendum Note (Signed)
Addended by: Eldred Manges on: 10/09/2022 10:06 AM   Modules accepted: Orders

## 2022-10-09 NOTE — Telephone Encounter (Signed)
Will forward this message to [place] but I think we can probably send a demographic sheet  from our office.  Thanks.

## 2022-10-09 NOTE — Telephone Encounter (Signed)
Sent  to Dr.Greene 

## 2022-10-09 NOTE — Telephone Encounter (Signed)
[facility] is asking for a demographic sheet for our mutual pt. Is this usually done by Erica L.

## 2022-10-11 ENCOUNTER — Telehealth: Payer: BC Managed Care – PPO | Admitting: Family Medicine

## 2022-10-11 ENCOUNTER — Encounter: Payer: Self-pay | Admitting: Family Medicine

## 2022-10-11 DIAGNOSIS — G629 Polyneuropathy, unspecified: Secondary | ICD-10-CM | POA: Diagnosis not present

## 2022-10-11 DIAGNOSIS — E291 Testicular hypofunction: Secondary | ICD-10-CM

## 2022-10-11 NOTE — Patient Instructions (Signed)
Thanks for taking the video call today.  Although the total testosterone did not come back on labs, the free testosterone was normal as well as the SHBG and prolactin levels.  As we discussed I think it may be best to have endocrinology review these results and decide on further testing or to go ahead and start testosterone supplementation.  Let me know if there are further questions regarding the risks versus benefits of testosterone but endocrinology may also be able to provide some further information.  If they do start you on supplementation, I am happy to monitor that and refill doses if more convenient through our office.  Glad to hear that you do not have side effects on higher dose of gabapentin.  I think it is reasonable to try additional 300 mg in the morning for a total dose of 600 mg in the morning, 300 mg in the afternoon, 600 mg at night and see if that provides any relief.  Further dosage increases can be discussed with neurology.  Back off to the previous dose if new side effects.  Please let me know if there are questions.  Take care.

## 2022-10-11 NOTE — Progress Notes (Signed)
Virtual Visit via Video Note  I connected with Jaquelyn Bitter on 10/11/22 at 10:25 AM by a video enabled telemedicine application and verified that I am speaking with the correct person using two identifiers.  Patient location: home, by self.  My location: office - [place].    I discussed the limitations, risks, security and privacy concerns of performing an evaluation and management service by telephone and the availability of in person appointments. I also discussed with the patient that there may be a patient responsible charge related to this service. The patient expressed understanding and agreed to proceed, consent obtained  Chief complaint:  Chief Complaint  Patient presents with   Peripheral Neuropathy    Pt notes doesn't tell a difference    Results    Discuss testosterone notes the specialist has not called him back yet     History of Present Illness: Philip Sherman is a 52 y.o. male  Low testosterone: See prior visits.  169.98 33-month ago, repeat testing 2 weeks ago.  Unfortunately the actual testosterone level was canceled but free testosterone 8.1, SHBG 8.0.  Prolactin was normal.  Previously readings were low with prior primary care provider. Has not taken supplementation prior, plans on follow up with endocrinology - called office, has not heard back yet.   Peripheral neuropathy See last visit, followed by neurology, plan for other specialty eval.  Small fiber neuropathy.  Option of higher dose gabapentin at night with 2 nightly, 1 in the morning initially as well as as cleared by his neurologist.  Did increase dose of gabapentin, now on 600mg  at bedtime, 1 in the morning, 1 in the afternoon. No side effects, minimal change in symptoms.   Appt in November with [facility] with small fiber neuropathy specialist. Dr. Lurlean Leyden.    There are no problems to display for this patient.  No past medical history on file. Past Surgical History:   Procedure Laterality Date   APPENDECTOMY     BACK SURGERY     x3   COLONOSCOPY  07/20/2014   Dr.Gillis    DG THUMB RIGHT HAND ([facility] HX)     index finger     KNEE ARTHROSCOPY W/ ACL RECONSTRUCTION     No Known Allergies Prior to Admission medications   Medication Sig Start Date End Date Taking? Authorizing Provider  gabapentin (NEURONTIN) 300 MG capsule Take 600 mg by mouth at bedtime. 02/28/22  Yes [provider]  lisinopril (ZESTRIL) 10 MG tablet Take 1 tablet by mouth daily. 02/10/22  Yes [provider]  Multiple Vitamin (MULTIVITAMIN) tablet Take 1 tablet by mouth daily.   Yes [provider]  rosuvastatin (CRESTOR) 10 MG tablet Take 10 mg by mouth daily. 11/09/21  Yes [provider]  zolpidem (AMBIEN CR) 6.25 MG CR tablet Take 1 tablet (6.25 mg total) by mouth at bedtime as needed. 08/25/22  Yes Shade Flood, MD   Social History   Socioeconomic History   Marital status: Married    Spouse name: Not on file   Number of children: Not on file   Years of education: Not on file   Highest education level: Bachelor's degree (e.g., BA, AB, BS)  Occupational History   Not on file  Tobacco Use   Smoking status: Never   Smokeless tobacco: Never  Vaping Use   Vaping status: Never Used  Substance and Sexual Activity   Alcohol use: Yes    Alcohol/week: 4.0 standard drinks of alcohol  Types: 4 Standard drinks or equivalent per week   Drug use: Not Currently   Sexual activity: Yes  Other Topics Concern   Not on file  Social History Narrative   ** Merged History Encounter **       Social Determinants of Health   Financial Resource Strain: Low Risk  (09/24/2022)   Overall Financial Resource Strain (CARDIA)    Difficulty of Paying Living Expenses: Not hard at all  Food Insecurity: No Food Insecurity (09/24/2022)   Hunger Vital Sign    Worried About Running Out of Food in the Last Year: Never true    Ran Out of Food in the Last Year:  Never true  Transportation Needs: No Transportation Needs (09/24/2022)   PRAPARE - Administrator, Civil Service (Medical): No    Lack of Transportation (Non-Medical): No  Physical Activity: Unknown (09/24/2022)   Exercise Vital Sign    Days of Exercise per Week: 0 days    Minutes of Exercise per Session: Not on file  Stress: No Stress Concern Present (09/24/2022)   [employer] of Occupational Health - Occupational Stress Questionnaire    Feeling of Stress : Only a little  Social Connections: Moderately Integrated (09/24/2022)   Social Connection and Isolation Panel [NHANES]    Frequency of Communication with Friends and Family: More than three times a week    Frequency of Social Gatherings with Friends and Family: More than three times a week    Attends Religious Services: More than 4 times per year    Active Member of [employer] or Organizations: No    Attends Engineer, structural: Not on file    Marital Status: Married  Catering manager Violence: Not At Risk (03/02/2022)   Received from [employer] [employer], [facility]   HITS    Over the last 12 months how often did your partner physically hurt you?: 1    Over the last 12 months how often did your partner insult you or talk down to you?: 1    Over the last 12 months how often did your partner threaten you with physical harm?: 1    Over the last 12 months how often did your partner scream or curse at you?: 1    Observations/Objective: There were no vitals filed for this visit., Nontoxic appearance on video, speaking full sentences, no respiratory distress, appropriate responses, all questions answered with understanding of plan expressed. Assessment and Plan: Small fiber neuropathy  -Neurology.  Minimal change in symptoms with higher dose of gabapentin but no side effects.  Can try slight higher dose of gabapentin at 600 mg in the morning, continue 300 mg in afternoon, 600 mg at night.  Would not change dosages any  sooner than 1 week.  Can discuss further doses changes with his neurologist.  Hypogonadism male  -Previous low testosterone, including multiple lows at prior primary care provider.  Recent testing with normal free testosterone, SHBG and prolactin.  Will have him meet with endocrinology to discuss further testing or treatment.  Potential side effects/risk of testosterone therapy were discussed.  Follow Up Instructions:  As needed. I discussed the assessment and treatment plan with the patient. The patient was provided an opportunity to ask questions and all were answered. The patient agreed with the plan and demonstrated an understanding of the instructions.   The patient was advised to call back or seek an in-person evaluation if the symptoms worsen or if the condition fails to improve as anticipated.  Shade Flood, MD

## 2022-10-24 ENCOUNTER — Telehealth: Payer: Self-pay | Admitting: Family Medicine

## 2022-10-24 ENCOUNTER — Encounter: Payer: Self-pay | Admitting: Family Medicine

## 2022-10-24 DIAGNOSIS — E291 Testicular hypofunction: Secondary | ICD-10-CM

## 2022-10-24 NOTE — Telephone Encounter (Signed)
Pt received VM and was returning Erica's call. Pt stated he would be available the next few hours and apologizes that he was in a meeting at the time of the call.

## 2022-10-24 NOTE — Telephone Encounter (Signed)
Caller name: LORETTA ANDREOLI  On DPR?: Yes  Call back number: [phone] (home)  Provider they see: Shade Flood, MD  Reason for call:   Pt just called. Just left visit with referral endocrinologist and he said they won't see again without notes. They should be faxed to their office.   Please fax 'all notes, etc. to [phone]'.

## 2022-10-24 NOTE — Telephone Encounter (Signed)
Called the office to see what was going on with the referral. Spoke to [facility] in Referrals she let me know that Dr.Balan declined the referral. I did ask if she had any other recommendations and she said no. I have routed the referral to [facility] but they have a hold on scheduling at this time.   I did LM asking pt to call me back.   Alcario Drought

## 2022-10-25 NOTE — Telephone Encounter (Signed)
Not sure I understand all the concerns with this encounter.  Did they not receive our notes at his endocrinology referral initially?  Are they planning to see patient back after they have received those notes, or has Dr. Talmage Nap declined to see patient back, even with notes?  I do not know any specific endocrinologist with [facility] but I am fine with him seeing somebody there.  We can send referral there if needed.

## 2022-10-25 NOTE — Telephone Encounter (Signed)
Is there additional information?

## 2022-10-26 ENCOUNTER — Ambulatory Visit (INDEPENDENT_AMBULATORY_CARE_PROVIDER_SITE_OTHER): Payer: BC Managed Care – PPO

## 2022-10-26 DIAGNOSIS — Z23 Encounter for immunization: Secondary | ICD-10-CM | POA: Diagnosis not present

## 2022-10-26 NOTE — Telephone Encounter (Signed)
Spoke to patient regarding options, I will refer him to urology locally to see if that may be easier.  If difficulty with that referral then we can pursue endocrinology through [facility].  Thanks

## 2022-10-26 NOTE — Progress Notes (Signed)
Pt came in for his flu vaccine . Tolerated well . Gave injection in left deltoid . Pt had no concerns at this time

## 2022-10-26 NOTE — Addendum Note (Signed)
Addended by: Meredith Staggers R on: 10/26/2022 05:43 PM   Modules accepted: Orders

## 2022-11-10 NOTE — Addendum Note (Signed)
Addended byJaci Lazier, Fritzie Prioleau on: 11/10/2022 10:58 AM   Modules accepted: Orders

## 2022-11-16 ENCOUNTER — Encounter: Payer: Self-pay | Admitting: Family Medicine

## 2023-01-16 ENCOUNTER — Encounter: Payer: Self-pay | Admitting: Family Medicine

## 2023-02-19 ENCOUNTER — Encounter: Payer: Self-pay | Admitting: Family Medicine

## 2023-02-26 ENCOUNTER — Ambulatory Visit: Payer: BC Managed Care – PPO | Admitting: Family Medicine

## 2023-02-26 VITALS — BP 114/68 | HR 71 | Temp 98.5°F | Ht 77.0 in | Wt 265.8 lb

## 2023-02-26 DIAGNOSIS — G4700 Insomnia, unspecified: Secondary | ICD-10-CM

## 2023-02-26 DIAGNOSIS — Z9889 Other specified postprocedural states: Secondary | ICD-10-CM | POA: Diagnosis not present

## 2023-02-26 DIAGNOSIS — E291 Testicular hypofunction: Secondary | ICD-10-CM | POA: Diagnosis not present

## 2023-02-26 DIAGNOSIS — E785 Hyperlipidemia, unspecified: Secondary | ICD-10-CM

## 2023-02-26 DIAGNOSIS — Z1159 Encounter for screening for other viral diseases: Secondary | ICD-10-CM

## 2023-02-26 DIAGNOSIS — G629 Polyneuropathy, unspecified: Secondary | ICD-10-CM

## 2023-02-26 DIAGNOSIS — I10 Essential (primary) hypertension: Secondary | ICD-10-CM | POA: Diagnosis not present

## 2023-02-26 DIAGNOSIS — Z23 Encounter for immunization: Secondary | ICD-10-CM

## 2023-02-26 LAB — COMPREHENSIVE METABOLIC PANEL
ALT: 26 U/L (ref 0–53)
AST: 28 U/L (ref 0–37)
Albumin: 4.4 g/dL (ref 3.5–5.2)
Alkaline Phosphatase: 54 U/L (ref 39–117)
BUN: 14 mg/dL (ref 6–23)
CO2: 29 meq/L (ref 19–32)
Calcium: 9.4 mg/dL (ref 8.4–10.5)
Chloride: 99 meq/L (ref 96–112)
Creatinine, Ser: 0.92 mg/dL (ref 0.40–1.50)
GFR: 94.87 mL/min (ref >60.00)
Glucose, Bld: 73 mg/dL (ref 70–99)
Potassium: 4.7 meq/L (ref 3.5–5.1)
Sodium: 139 meq/L (ref 135–145)
Total Bilirubin: 0.7 mg/dL (ref 0.2–1.2)
Total Protein: 7 g/dL (ref 6.0–8.3)

## 2023-02-26 LAB — LIPID PANEL
Cholesterol: 89 mg/dL (ref 0–200)
HDL: 30.2 mg/dL — ABNORMAL LOW (ref >39.00)
LDL Cholesterol: 44 mg/dL (ref 0–99)
NonHDL: 59.02
Total CHOL/HDL Ratio: 3
Triglycerides: 75 mg/dL (ref 0.0–149.0)
VLDL: 15 mg/dL (ref 0.0–40.0)

## 2023-02-26 MED ORDER — GABAPENTIN 300 MG PO CAPS: 300.0000 mg | ORAL_CAPSULE | Freq: Three times a day (TID) | ORAL | 0 refills | Status: DC | PRN

## 2023-02-26 NOTE — Patient Instructions (Addendum)
I would try to taper back on the gabapentin - lower by 300mg  every few days to week and if no change in neuropathy symptoms, as you taper off we can try the nortriptyline as recommended by neurology. I will refer you to Dr. Wynetta Emery.   Once you can get on the nortriptyline, that should help with sleep.   You can bring the BP meter to next visit.    How to Take Your Blood Pressure Blood pressure is a measurement of how strongly your blood is pressing against the walls of your arteries. Arteries are blood vessels that carry blood from your heart throughout your body. Your health care provider takes your blood pressure at each office visit. You can also take your own blood pressure at home with a blood pressure monitor. You may need to take your own blood pressure to: Confirm a diagnosis of high blood pressure (hypertension). Monitor your blood pressure over time. Make sure your blood pressure medicine is working. Supplies needed: Blood pressure monitor. A chair to sit in. This should be a chair where you can sit upright with your back supported. Do not sit on a soft couch or an armchair. Table or desk. Small notebook and pencil or pen. How to prepare To get the most accurate reading, avoid the following for 30 minutes before you check your blood pressure: Drinking caffeine. Drinking alcohol. Eating. Smoking. Exercising. Five minutes before you check your blood pressure: Use the bathroom and urinate so that you have an empty bladder. Sit quietly in a chair. Do not talk. How to take your blood pressure To check your blood pressure, follow the instructions in the manual that came with your blood pressure monitor. If you have a digital blood pressure monitor, the instructions may be as follows: Sit up straight in a chair. Place your feet on the floor. Do not cross your ankles or legs. Rest your left arm at the level of your heart on a table or desk or on the arm of a chair. Pull up your shirt  sleeve. Wrap the blood pressure cuff around the upper part of your left arm, 1 inch (2.5 cm) above your elbow. It is best to wrap the cuff around bare skin. Fit the cuff snugly, but not too tightly, around your arm. You should be able to place only one finger between the cuff and your arm. Position the cord so that it rests in the bend of your elbow. Press the power button. Sit quietly while the cuff inflates and deflates. Read the digital reading on the monitor screen and write the numbers down (record them) in a notebook. Wait 2-3 minutes, then repeat the steps, starting at step 1. What does my blood pressure reading mean? A blood pressure reading consists of a higher number over a lower number. Ideally, your blood pressure should be below 120/80. The first ("top") number is called the systolic pressure. It is a measure of the pressure in your arteries as your heart beats. The second ("bottom") number is called the diastolic pressure. It is a measure of the pressure in your arteries as the heart relaxes. Blood pressure is classified into four stages. The following are the stages for adults who do not have a short-term serious illness or a chronic condition. Systolic pressure and diastolic pressure are measured in a unit called mm Hg (millimeters of mercury).  Normal Systolic pressure: below 120. Diastolic pressure: below 80. Elevated Systolic pressure: 120-129. Diastolic pressure: below 80. Hypertension stage 1  Systolic pressure: 130-139. Diastolic pressure: 80-89. Hypertension stage 2 Systolic pressure: 140 or above. Diastolic pressure: 90 or above. You can have elevated blood pressure or hypertension even if only the systolic or only the diastolic number in your reading is higher than normal. Follow these instructions at home: Medicines Take over-the-counter and prescription medicines only as told by your health care provider. Tell your health care provider if you are having any side  effects from blood pressure medicine. General instructions Check your blood pressure as often as recommended by your health care provider. Check your blood pressure at the same time every day. Take your monitor to the next appointment with your health care provider to make sure that: You are using it correctly. It provides accurate readings. Understand what your goal blood pressure numbers are. Keep all follow-up visits. This is important. General tips Your health care provider can suggest a reliable monitor that will meet your needs. There are several types of home blood pressure monitors. Choose a monitor that has an arm cuff. Do not choose a monitor that measures your blood pressure from your wrist or finger. Choose a cuff that wraps snugly, not too tight or too loose, around your upper arm. You should be able to fit only one finger between your arm and the cuff. You can buy a blood pressure monitor at most drugstores or online. Where to find more information American Heart Association: www.heart.org Contact a health care provider if: Your blood pressure is consistently high. Your blood pressure is suddenly low. Get help right away if: Your systolic blood pressure is higher than 180. Your diastolic blood pressure is higher than 120. These symptoms may be an emergency. Get help right away. Call 911. Do not wait to see if the symptoms will go away. Do not drive yourself to the hospital. Summary Blood pressure is a measurement of how strongly your blood is pressing against the walls of your arteries. A blood pressure reading consists of a higher number over a lower number. Ideally, your blood pressure should be below 120/80. Check your blood pressure at the same time every day. Avoid caffeine, alcohol, smoking, and exercise for 30 minutes prior to checking your blood pressure. These agents can affect the accuracy of the blood pressure reading. This information is not intended to replace  advice given to you by your health care provider. Make sure you discuss any questions you have with your health care provider. Document Revised: 10/28/2020 Document Reviewed: 10/28/2020 Elsevier Patient Education  2024 [employer].

## 2023-02-27 ENCOUNTER — Ambulatory Visit: Payer: BC Managed Care – PPO | Admitting: Endocrinology

## 2023-02-27 ENCOUNTER — Encounter: Payer: Self-pay | Admitting: Family Medicine

## 2023-03-08 ENCOUNTER — Encounter: Payer: Self-pay | Admitting: Family Medicine

## 2023-03-09 NOTE — Telephone Encounter (Signed)
 Pt requesting letter of accomodation for stationary bike.

## 2023-03-09 NOTE — Telephone Encounter (Signed)
 Please see letter section, this was completed December 31.  I have reprinted that if needed and can sign on Monday.

## 2023-04-09 ENCOUNTER — Encounter: Payer: Self-pay | Admitting: Family Medicine

## 2023-04-09 DIAGNOSIS — G629 Polyneuropathy, unspecified: Secondary | ICD-10-CM

## 2023-04-10 NOTE — Telephone Encounter (Signed)
Patient wants to know your thoughts on Pulsed [place] on his feet, notes would need referral to [facility] if appropriate,

## 2023-04-10 NOTE — Telephone Encounter (Signed)
Patient responded with provider information for referral

## 2023-04-17 NOTE — Addendum Note (Signed)
 Addended by: Meredith Staggers R on: 04/17/2023 10:37 AM   Modules accepted: Orders

## 2023-04-27 ENCOUNTER — Ambulatory Visit: Payer: BC Managed Care – PPO | Admitting: Family Medicine

## 2023-05-03 ENCOUNTER — Ambulatory Visit: Payer: BC Managed Care – PPO | Admitting: Family Medicine

## 2023-05-03 VITALS — BP 118/70 | HR 82 | Temp 98.6°F | Ht 77.0 in | Wt 241.6 lb

## 2023-05-03 DIAGNOSIS — I10 Essential (primary) hypertension: Secondary | ICD-10-CM

## 2023-05-03 DIAGNOSIS — G629 Polyneuropathy, unspecified: Secondary | ICD-10-CM | POA: Diagnosis not present

## 2023-05-03 DIAGNOSIS — G4700 Insomnia, unspecified: Secondary | ICD-10-CM | POA: Diagnosis not present

## 2023-05-03 DIAGNOSIS — E785 Hyperlipidemia, unspecified: Secondary | ICD-10-CM | POA: Diagnosis not present

## 2023-05-03 MED ORDER — NORTRIPTYLINE HCL 25 MG PO CAPS: 50.0000 mg | ORAL_CAPSULE | Freq: Every day | ORAL | 3 refills | Status: DC

## 2023-05-03 MED ORDER — LISINOPRIL 10 MG PO TABS: 10.0000 mg | ORAL_TABLET | Freq: Every day | ORAL | 2 refills | Status: DC

## 2023-05-03 MED ORDER — ROSUVASTATIN CALCIUM 10 MG PO TABS: 10.0000 mg | ORAL_TABLET | Freq: Every day | ORAL | 2 refills | Status: DC

## 2023-05-03 NOTE — Progress Notes (Signed)
 Subjective:  Patient ID: Philip Sherman, male    DOB: [birth year]  Age: 53 y.o. MRN: [id number]  CC:  Chief Complaint  Patient presents with   Medical Management of Chronic Issues    Pt is doing okay but did want to let us know his Urologist did inform him he does have Blood in his urine and will continue to track this    Health Maintenance    Patient declined having HIV screening but notes last time he had a Hepatitis screening it indicated "exposure" unsure if Hep C, patient would like to discuss pneumonia vaccine as he isn't sure the point to so many "flu like" vaccines, COVID-19 vaccine was administered to patient once last year     HPI STILLMAN BUENGER presents for   Small fiber neuropathy See previous notes.  Peripheral neuropathy with possible component of prior lumbar spine disease, radiculopathy may be contributing, evaluation with neurologist at [facility].  Recently ordered pulsed ultrasound as attempt to improve symptoms.  Option to taper back on the gabapentin if no change in neuropathy symptoms and referred to Dr. Wynetta Emery at his December visit.  Plan for transition to nortriptyline in place of gabapentin to help with sleep as well.  Insomnia discussed last visit.  Also plan for bike exercise given neuropathy symptoms.  Letter provided last visit for exercise bike.  Has been biking and improved nutrition - less processed food.  Weight has been improving. Down to 241 today.  33% body fat, now 17%.  Sleeping better, no change in foot symptoms. Trying red light therapy, pulsed ultrasound was not available.  Weaned off gabapentin - off past month, now on nortriptyline - past 2 weeks, has increased to 2 caps, plans to increase to 3 next week.  Was told it may take up to 4-6 weeks to see benefit.  No side effects.   Will be having nerve conduction testing to see if back issues may be contributing - will then follow up with Dr. Wynetta Emery.   Wt Readings from Last 3 Encounters:   05/03/23 241 lb 9.6 oz (109.6 kg)  02/26/23 265 lb 12.8 oz (120.6 kg)  09/25/22 264 lb 6.4 oz (119.9 kg)    Hypogonadism, hematuria Followed by urology, ongoing monitoring planned.Note reviewed from Dr. Alvester Morin on 04/16/2023.  CT scan without any abnormalities upper tract to contribute to microscopic hematuria.  Cystoscopy was declined.  Urinalysis was negative for red blood cells at that visit.  Plan for consideration of cystoscopy in the future if gross hematuria or other episodes of microscopic hematuria.  Continued on IM testosterone treatment.  Health maintenance, see information above.  Possible exposure to hepatitis previously - no disease.  Vaccines discussed: Defers PNA vaccine at this time Had covid booster last fall.     History Patient Active Problem List   Diagnosis Date Noted   Obesity (BMI 30-39.9) 07/04/2022   Hypertension 02/16/2022   Coronary artery disease due to calcified coronary lesion 02/09/2022   Elevated blood pressure reading in office with white coat syndrome, without diagnosis of hypertension 02/09/2022   GAD (generalized anxiety disorder) 02/09/2022   Hepatitis C antibody positive in blood 02/09/2022   Low testosterone in male 02/09/2022   Restless leg syndrome 02/09/2022   Football 02/06/2022   Personal history of multiple concussions 02/06/2022   Hyperlipidemia 01/01/2021   Degeneration of lumbar intervertebral disc 11/26/2020   Idiopathic peripheral neuropathy 02/12/2019   Bilateral plantar fasciitis 11/29/2018   Peripheral neuropathy 07/04/2018  No past medical history on file. Past Surgical History:  Procedure Laterality Date   APPENDECTOMY     BACK SURGERY     x3   COLONOSCOPY  07/20/2014   Dr.Gillis    DG THUMB RIGHT HAND ([facility] HX)     index finger     KNEE ARTHROSCOPY W/ ACL RECONSTRUCTION     No Known Allergies Prior to Admission medications   Medication Sig Start Date End Date Taking? Authorizing Provider  lisinopril (ZESTRIL) 10  MG tablet Take 1 tablet by mouth daily. 02/10/22  Yes [provider]  Multiple Vitamin (MULTIVITAMIN) tablet Take 1 tablet by mouth daily.   Yes [provider]  nortriptyline (PAMELOR) 25 MG capsule Take 50 mg by mouth at bedtime.   Yes [provider]  rosuvastatin (CRESTOR) 10 MG tablet Take 10 mg by mouth daily. 11/09/21  Yes [provider]   Social History   Socioeconomic History   Marital status: Married    Spouse name: Not on file   Number of children: Not on file   Years of education: Not on file   Highest education level: Bachelor's degree (e.g., BA, AB, BS)  Occupational History   Not on file  Tobacco Use   Smoking status: Never   Smokeless tobacco: Never  Vaping Use   Vaping status: Never Used  Substance and Sexual Activity   Alcohol use: Yes    Alcohol/week: 4.0 standard drinks of alcohol    Types: 4 Standard drinks or equivalent per week   Drug use: Not Currently   Sexual activity: Yes  Other Topics Concern   Not on file  Social History Narrative   ** Merged History Encounter **       Social Drivers of Health   Financial Resource Strain: Low Risk  (05/03/2023)   Overall Financial Resource Strain (CARDIA)    Difficulty of Paying Living Expenses: Not hard at all  Food Insecurity: No Food Insecurity (05/03/2023)   Hunger Vital Sign    Worried About Running Out of Food in the Last Year: Never true    Ran Out of Food in the Last Year: Never true  Transportation Needs: No Transportation Needs (05/03/2023)   PRAPARE - Administrator, Civil Service (Medical): No    Lack of Transportation (Non-Medical): No  Physical Activity: Sufficiently Active (05/03/2023)   Exercise Vital Sign    Days of Exercise per Week: 3 days    Minutes of Exercise per Session: 60 min  Stress: Stress Concern Present (05/03/2023)   [employer] of Occupational Health - Occupational Stress Questionnaire    Feeling of Stress : Very much  Social  Connections: Moderately Integrated (05/03/2023)   Social Connection and Isolation Panel [NHANES]    Frequency of Communication with Friends and Family: Three times a week    Frequency of Social Gatherings with Friends and Family: Once a week    Attends Religious Services: Never    Database administrator or Organizations: Yes    Attends Banker Meetings: 1 to 4 times per year    Marital Status: Married  Catering manager Violence: Not At Risk (03/02/2022)   Received from [facility], [facility]   HITS    Over the last 12 months how often did your partner physically hurt you?: Never    Over the last 12 months how often did your partner insult you or talk down to you?: Never    Over the last 12  months how often did your partner threaten you with physical harm?: Never    Over the last 12 months how often did your partner scream or curse at you?: Never    Review of Systems Per HPI  Objective:   Vitals:   05/03/23 1351  BP: 118/70  Pulse: 82  Temp: 98.6 F (37 C)  TempSrc: Temporal  SpO2: 96%  Weight: 241 lb 9.6 oz (109.6 kg)  Height: 6\' 5"  (1.956 m)     Physical Exam Vitals reviewed.  Constitutional:      Appearance: He is well-developed.  HENT:     Head: Normocephalic and atraumatic.  Neck:     Vascular: No carotid bruit or JVD.  Cardiovascular:     Rate and Rhythm: Normal rate and regular rhythm.     Heart sounds: Normal heart sounds. No murmur heard. Pulmonary:     Effort: Pulmonary effort is normal.     Breath sounds: Normal breath sounds. No rales.  Musculoskeletal:     Right lower leg: No edema.     Left lower leg: No edema.  Skin:    General: Skin is warm and dry.  Neurological:     Mental Status: He is alert and oriented to person, place, and time.  Psychiatric:        Mood and Affect: Mood normal.     Assessment & Plan:  BHARGAV BARBARO is a 54 y.o. male . Small fiber neuropathy - Plan: nortriptyline (PAMELOR) 25 MG  capsule  -Persistent but overall stable symptoms.  Plan for follow-up for nerve conduction study as above and then follow-up with neurosurgeon.  Tolerating nortriptyline, may still take some more time to see effectiveness as he recently started.  Currently on 2 pills, plans to increase to 3, refill ordered.  Hyperlipidemia, unspecified hyperlipidemia type - Plan: rosuvastatin (CRESTOR) 10 MG tablet  -Tolerating current med regimen, labs previously stable.  Refilled.  Essential hypertension - Plan: lisinopril (ZESTRIL) 10 MG tablet  -Stable, labs in December stable.  Continue same dose lisinopril, refilled.  Insomnia, unspecified type - Plan: nortriptyline (PAMELOR) 25 MG capsule  -Improved likely component of exercise as well as nortriptyline, no changes.  Meds ordered this encounter  Medications   nortriptyline (PAMELOR) 25 MG capsule    Sig: Take 2-3 capsules (50-75 mg total) by mouth at bedtime.    Dispense:  90 capsule    Refill:  3   rosuvastatin (CRESTOR) 10 MG tablet    Sig: Take 1 tablet (10 mg total) by mouth daily.    Dispense:  90 tablet    Refill:  2   lisinopril (ZESTRIL) 10 MG tablet    Sig: Take 1 tablet (10 mg total) by mouth daily.    Dispense:  90 tablet    Refill:  2   Patient Instructions  Thanks for coming in today.  No med changes at this time. Recheck in 3 months and we will check labs at that time.      Signed,   Meredith Staggers, MD [facility], [facility] 05/03/23 2:04 PM

## 2023-05-03 NOTE — Patient Instructions (Signed)
 Thanks for coming in today.  No med changes at this time. Recheck in 3 months and we will check labs at that time.

## 2023-08-08 ENCOUNTER — Ambulatory Visit (INDEPENDENT_AMBULATORY_CARE_PROVIDER_SITE_OTHER): Admitting: Family Medicine

## 2023-08-08 VITALS — BP 122/70 | HR 97 | Temp 98.4°F | Ht 77.0 in | Wt 238.8 lb

## 2023-08-08 DIAGNOSIS — I10 Essential (primary) hypertension: Secondary | ICD-10-CM

## 2023-08-08 DIAGNOSIS — E785 Hyperlipidemia, unspecified: Secondary | ICD-10-CM | POA: Diagnosis not present

## 2023-08-08 DIAGNOSIS — G629 Polyneuropathy, unspecified: Secondary | ICD-10-CM | POA: Diagnosis not present

## 2023-08-08 DIAGNOSIS — G4700 Insomnia, unspecified: Secondary | ICD-10-CM | POA: Diagnosis not present

## 2023-08-08 MED ORDER — NORTRIPTYLINE HCL 50 MG PO CAPS: 100.0000 mg | ORAL_CAPSULE | Freq: Every day | ORAL | 1 refills | Status: DC

## 2023-08-08 NOTE — Progress Notes (Signed)
 Subjective:  Patient ID: Philip Sherman, male    DOB: 12/20/1970  Age: 53 y.o. MRN: [id number]  CC:  Chief Complaint  Patient presents with   Medical Management of Chronic Issues    Pt is doing well, no concerns     HPI Philip Sherman presents for   Neuropathy/small fiber neuropathy Last discussed in March.  Had been transition to nortriptyline  in place of gabapentin  to help with sleep as well as neuropathy symptoms.  Had been seen by neurologist at [facility].  There was a thought of possible peripheral neuropathy with potential component of prior lumbar spine disease or radiculopathy.Plan for follow-up with Dr. Lamon Pillow depending on nerve conduction testing to see if lumbar spine may be contributing. Bike exercise to assist with neuropathy management.  He was off gabapentin  for approximately 1 month at his March visit, tapering up on dose at that time at 2 caps per day.  Was sleeping better but no significant change in neuropathy symptoms at that time.   Had ESI with neurosurgery yesterday for back stiffness.   No change in foot symptoms.  Sleeping ok - sometimes still having trouble.  No side effects with nortriptyline .  Has been receiving treatment from chiro - stim unit. And pulsed ultrasound.  Red light therapy at wellness facility at [facility].   History of hematuria Also discussed in March.  Followed by urology with ongoing monitoring, CT without any abnormalities and upper tract in February 2 contribute to microscopic hematuria.  Cystoscopy discussed but declined.  Urinalysis was negative for red blood cells at urology visit, with option of cystoscopy in the future if gross hematuria or other episodes of microscopic hematuria returned. He is on testosterone  treatment for hypogonadism. Recent urology visit 6/9 - no concerns.    Hyperlipidemia: Treated with Crestor  10 mg daily. No new myalgias. Side effects. Recent cardiology visit as below. Not taking ASA -  discussed risks/benefits.  Lab Results  Component Value Date   CHOL 89 02/26/2023   HDL 30.20 (L) 02/26/2023   LDLCALC 44 02/26/2023   TRIG 75.0 02/26/2023   CHOLHDL 3 02/26/2023   Lab Results  Component Value Date   ALT 26 02/26/2023   AST 28 02/26/2023   ALKPHOS 54 02/26/2023   BILITOT 0.7 02/26/2023    Hypertension: Treated with lisinopril  10 mg daily, has been seen by cardiology, Dr. Terry Ficks with [facility].  Last office visit May 12.  Doing well at that time, 2-year follow-up planned.  Continue on Crestor .  Option of aspirin 81 mg daily given coronary artery calcification.  Has been working on diet and exercise - 50# weight loss past 6 months - 265 to 238 on our readings. Up to 280 at home early December  Wt Readings from Last 3 Encounters:  08/08/23 238 lb 12.8 oz (108.3 kg)  05/03/23 241 lb 9.6 oz (109.6 kg)  02/26/23 265 lb 12.8 oz (120.6 kg)  No side effects with meds.  Home readings: none BP Readings from Last 3 Encounters:  08/08/23 122/70  05/03/23 118/70  02/26/23 114/68   Lab Results  Component Value Date   CREATININE 0.92 02/26/2023     History Patient Active Problem List   Diagnosis Date Noted   Obesity (BMI 30-39.9) 07/04/2022   Hypertension 02/16/2022   Coronary artery disease due to calcified coronary lesion 02/09/2022   Elevated blood pressure reading in office with white coat syndrome, without diagnosis of hypertension 02/09/2022   GAD (generalized anxiety disorder)  02/09/2022   Hepatitis C antibody positive in blood 02/09/2022   Low testosterone  in male 02/09/2022   Restless leg syndrome 02/09/2022   Football 02/06/2022   Personal history of multiple concussions 02/06/2022   Hyperlipidemia 01/01/2021   Degeneration of lumbar intervertebral disc 11/26/2020   Idiopathic peripheral neuropathy 02/12/2019   Bilateral plantar fasciitis 11/29/2018   Peripheral neuropathy 07/04/2018   No past medical history on file. Past Surgical History:   Procedure Laterality Date   APPENDECTOMY     BACK SURGERY     x3   COLONOSCOPY  07/20/2014   Dr.Gillis    DG THUMB RIGHT HAND ([facility] HX)     index finger     KNEE ARTHROSCOPY W/ ACL RECONSTRUCTION     No Known Allergies Prior to Admission medications   Medication Sig Start Date End Date Taking? Authorizing Provider  lisinopril  (ZESTRIL ) 10 MG tablet Take 1 tablet (10 mg total) by mouth daily. 05/03/23   Benjiman Bras, MD  Multiple Vitamin (MULTIVITAMIN) tablet Take 1 tablet by mouth daily.    [provider]  nortriptyline  (PAMELOR ) 25 MG capsule Take 2-3 capsules (50-75 mg total) by mouth at bedtime. 05/03/23   Benjiman Bras, MD  rosuvastatin  (CRESTOR ) 10 MG tablet Take 1 tablet (10 mg total) by mouth daily. 05/03/23   Benjiman Bras, MD   Social History   Socioeconomic History   Marital status: Married    Spouse name: Not on file   Number of children: Not on file   Years of education: Not on file   Highest education level: Bachelor's degree (e.g., BA, AB, BS)  Occupational History   Not on file  Tobacco Use   Smoking status: Never   Smokeless tobacco: Never  Vaping Use   Vaping status: Never Used  Substance and Sexual Activity   Alcohol use: Yes    Alcohol/week: 4.0 standard drinks of alcohol    Types: 4 Standard drinks or equivalent per week   Drug use: Not Currently   Sexual activity: Yes  Other Topics Concern   Not on file  Social History Narrative   ** Merged History Encounter **       Social Drivers of Health   Financial Resource Strain: Low Risk  (05/03/2023)   Overall Financial Resource Strain (CARDIA)    Difficulty of Paying Living Expenses: Not hard at all  Food Insecurity: No Food Insecurity (05/03/2023)   Hunger Vital Sign    Worried About Running Out of Food in the Last Year: Never true    Ran Out of Food in the Last Year: Never true  Transportation Needs: No Transportation Needs (05/03/2023)   PRAPARE - Scientist, research (physical sciences) (Medical): No    Lack of Transportation (Non-Medical): No  Physical Activity: Sufficiently Active (05/03/2023)   Exercise Vital Sign    Days of Exercise per Week: 3 days    Minutes of Exercise per Session: 60 min  Stress: Stress Concern Present (05/03/2023)   [employer] of Occupational Health - Occupational Stress Questionnaire    Feeling of Stress : Very much  Social Connections: Moderately Integrated (05/03/2023)   Social Connection and Isolation Panel [NHANES]    Frequency of Communication with Friends and Family: Three times a week    Frequency of Social Gatherings with Friends and Family: Once a week    Attends Religious Services: Never    Database administrator or Organizations: Yes    Attends Club or  Organization Meetings: 1 to 4 times per year    Marital Status: Married  Catering manager Violence: Not At Risk (03/02/2022)   [facility]   HITS    Over the last 12 months how often did your partner physically hurt you?: Never    Over the last 12 months how often did your partner insult you or talk down to you?: Never    Over the last 12 months how often did your partner threaten you with physical harm?: Never    Over the last 12 months how often did your partner scream or curse at you?: Never    Review of Systems  Constitutional:  Negative for fatigue and unexpected weight change.  Eyes:  Negative for visual disturbance.  Respiratory:  Negative for cough, chest tightness and shortness of breath.   Cardiovascular:  Negative for chest pain, palpitations and leg swelling.  Gastrointestinal:  Negative for abdominal pain and blood in stool.  Neurological:  Negative for dizziness, light-headedness and headaches.   Per HPI  Objective:   Vitals:   08/08/23 1444  BP: 122/70  Pulse: 97  Temp: 98.4 F (36.9 C)  TempSrc: Temporal  SpO2: 98%  Weight: 238 lb 12.8 oz (108.3 kg)  Height: 6' 5 (1.956 m)     Physical Exam Vitals  reviewed.  Constitutional:      Appearance: He is well-developed.  HENT:     Head: Normocephalic and atraumatic.  Neck:     Vascular: No carotid bruit or JVD.  Cardiovascular:     Rate and Rhythm: Normal rate and regular rhythm.     Heart sounds: Normal heart sounds. No murmur heard. Pulmonary:     Effort: Pulmonary effort is normal.     Breath sounds: Normal breath sounds. No rales.  Musculoskeletal:     Right lower leg: No edema.     Left lower leg: No edema.  Skin:    General: Skin is warm and dry.  Neurological:     Mental Status: He is alert and oriented to person, place, and time.  Psychiatric:        Mood and Affect: Mood normal.        Assessment & Plan:  Philip Sherman is a 54 y.o. male . Small fiber neuropathy - Plan: nortriptyline  (PAMELOR ) 50 MG capsule  - Minimal changes symptoms with nortriptyline , still some intermittent insomnia we will try higher dose at 100 mg nightly.  Monitor for side effects, RTC precautions.  Could have component of lumbar spine radiculopathy, hopefully recent injection may provide some relief as well.  Essential hypertension - Plan: Comprehensive metabolic panel with GFR  - Stable, no med changes.  Continue to monitor.  Labs as above.  Hyperlipidemia, unspecified hyperlipidemia type - Plan: Comprehensive metabolic panel with GFR, Lipid panel  - Tolerating current regimen, check labs and adjust plan accordingly.  Discussed option of aspirin 81 mg as recommended by cardiology.  Insomnia, unspecified type - Plan: nortriptyline  (PAMELOR ) 50 MG capsule  - As above, trial of higher dose nortriptyline .  Meds ordered this encounter  Medications   nortriptyline  (PAMELOR ) 50 MG capsule    Sig: Take 2 capsules (100 mg total) by mouth at bedtime.    Dispense:  180 capsule    Refill:  1   Patient Instructions  Thanks for coming in today.  Hoping the recent injection will help some of the back and foot symptoms if there is a component  of radiculopathy  from your lumbar spine.  Either way I think we can try slight higher dose of nortriptyline  at 100 mg/day.  I sent the 50 mg to your pharmacy, 2 per night.  Let me know how that does.  Follow-up in 6 months if everything is stable.  No change in cholesterol or blood pressure medications at this time.  As we discussed your cardiologist did give an option of aspirin 81 mg each day given the previous coronary artery calcification seen, but I will leave that up to you.  Take care!    Signed,   Caro Christmas, MD [facility], [facility] 08/08/23 2:58 PM

## 2023-08-08 NOTE — Patient Instructions (Signed)
 Thanks for coming in today.  Hoping the recent injection will help some of the back and foot symptoms if there is a component of radiculopathy from your lumbar spine.  Either way I think we can try slight higher dose of nortriptyline  at 100 mg/day.  I sent the 50 mg to your pharmacy, 2 per night.  Let me know how that does.  Follow-up in 6 months if everything is stable.  No change in cholesterol or blood pressure medications at this time.  As we discussed your cardiologist did give an option of aspirin 81 mg each day given the previous coronary artery calcification seen, but I will leave that up to you.  Take care!

## 2023-08-09 ENCOUNTER — Encounter: Payer: Self-pay | Admitting: Family Medicine

## 2023-08-09 LAB — COMPREHENSIVE METABOLIC PANEL WITH GFR
ALT: 21 U/L (ref 0–53)
AST: 25 U/L (ref 0–37)
Albumin: 4.6 g/dL (ref 3.5–5.2)
Alkaline Phosphatase: 55 U/L (ref 39–117)
BUN: 23 mg/dL (ref 6–23)
CO2: 29 meq/L (ref 19–32)
Calcium: 9.7 mg/dL (ref 8.4–10.5)
Chloride: 101 meq/L (ref 96–112)
Creatinine, Ser: 1.05 mg/dL (ref 0.40–1.50)
GFR: 80.7 mL/min (ref >60.00)
Glucose, Bld: 101 mg/dL — ABNORMAL HIGH (ref 70–99)
Potassium: 4.9 meq/L (ref 3.5–5.1)
Sodium: 137 meq/L (ref 135–145)
Total Bilirubin: 0.4 mg/dL (ref 0.2–1.2)
Total Protein: 7.4 g/dL (ref 6.0–8.3)

## 2023-08-09 LAB — LIPID PANEL
Cholesterol: 134 mg/dL (ref 0–200)
HDL: 48.1 mg/dL (ref >39.00)
LDL Cholesterol: 73 mg/dL (ref 0–99)
NonHDL: 85.6
Total CHOL/HDL Ratio: 3
Triglycerides: 65 mg/dL (ref 0.0–149.0)
VLDL: 13 mg/dL (ref 0.0–40.0)

## 2023-08-14 ENCOUNTER — Ambulatory Visit: Payer: Self-pay | Admitting: Family Medicine

## 2023-10-10 DIAGNOSIS — F411 Generalized anxiety disorder: Secondary | ICD-10-CM | POA: Diagnosis not present

## 2023-10-15 ENCOUNTER — Encounter: Payer: Self-pay | Admitting: Family Medicine

## 2023-10-16 NOTE — Telephone Encounter (Signed)
 Patient is asking if you know anything about or could provide insight about this product for weight loss called Lipo-Max Website link included: [email or web address]  Please advise Or I can call and set up appointment to discuss

## 2023-10-24 DIAGNOSIS — F411 Generalized anxiety disorder: Secondary | ICD-10-CM | POA: Diagnosis not present

## 2023-11-07 DIAGNOSIS — F411 Generalized anxiety disorder: Secondary | ICD-10-CM | POA: Diagnosis not present

## 2023-11-08 DIAGNOSIS — E291 Testicular hypofunction: Secondary | ICD-10-CM | POA: Diagnosis not present

## 2023-11-15 DIAGNOSIS — E291 Testicular hypofunction: Secondary | ICD-10-CM | POA: Diagnosis not present

## 2024-01-18 ENCOUNTER — Other Ambulatory Visit: Payer: Self-pay | Admitting: Family Medicine

## 2024-01-18 DIAGNOSIS — G4700 Insomnia, unspecified: Secondary | ICD-10-CM

## 2024-01-18 DIAGNOSIS — G629 Polyneuropathy, unspecified: Secondary | ICD-10-CM

## 2024-02-07 ENCOUNTER — Ambulatory Visit: Payer: Self-pay | Admitting: Family Medicine

## 2024-02-07 VITALS — BP 126/88 | HR 91 | Temp 98.7°F | Resp 15 | Ht 77.0 in | Wt 252.4 lb

## 2024-02-07 DIAGNOSIS — G4700 Insomnia, unspecified: Secondary | ICD-10-CM

## 2024-02-07 DIAGNOSIS — Z131 Encounter for screening for diabetes mellitus: Secondary | ICD-10-CM | POA: Diagnosis not present

## 2024-02-07 DIAGNOSIS — Z23 Encounter for immunization: Secondary | ICD-10-CM

## 2024-02-07 DIAGNOSIS — G629 Polyneuropathy, unspecified: Secondary | ICD-10-CM | POA: Diagnosis not present

## 2024-02-07 DIAGNOSIS — E785 Hyperlipidemia, unspecified: Secondary | ICD-10-CM

## 2024-02-07 DIAGNOSIS — I10 Essential (primary) hypertension: Secondary | ICD-10-CM | POA: Diagnosis not present

## 2024-02-07 MED ORDER — LISINOPRIL 10 MG PO TABS: 10.0000 mg | ORAL_TABLET | Freq: Every day | ORAL | 2 refills | Status: AC

## 2024-02-07 MED ORDER — NORTRIPTYLINE HCL 25 MG PO CAPS: 25.0000 mg | ORAL_CAPSULE | Freq: Every day | ORAL | 2 refills | Status: AC

## 2024-02-07 MED ORDER — NORTRIPTYLINE HCL 50 MG PO CAPS: 100.0000 mg | ORAL_CAPSULE | Freq: Every day | ORAL | 1 refills | Status: AC

## 2024-02-07 MED ORDER — ROSUVASTATIN CALCIUM 10 MG PO TABS: 10.0000 mg | ORAL_TABLET | Freq: Every day | ORAL | 2 refills | Status: AC

## 2024-02-07 NOTE — Progress Notes (Unsigned)
 Subjective:  Patient ID: Philip Sherman, male    DOB: 05/10/1970  Age: 53 y.o. MRN: [id number]  CC:  Chief Complaint  Patient presents with   Hypertension    6 month f/u. No concerns.    Insomnia    Still an issue for him.    HPI Philip Sherman presents for follow-up of chronic conditions.  Small fiber neuropathy/neuropathy Has been under the care of neurologist at [facility], transition to nortriptyline  in place of gabapentin  to help with sleep as well as neuropathy symptoms.  Was also evaluated by Dr. Onetha to determine if lumbar spine may be contributing.  Bike exercise to assist with neuropathy management.  Appears he was seen June 26 by Dr. Onetha, with diagnosis of lumbar stenosis with neurogenic claudication. Did not feel like back was primary issue, did have steroid injection that did not help feet, but helped low back.  Weight loss has helped. I increased his nortriptyline  to 100 mg daily at his last visit.  Had been on 75 mg/day prior. No side effects, no change in foot symptoms and sleep is the same.  Riding peloton.  Still trouble staying asleep. Neuropathy manageable. Able to play golf. Managing activity, avoiding prolonged standing.    History of hematuria He was evaluated by urology for history of hematuria.  CT without any abnormalities, cystoscopy was discussed but declined, plan for cystoscopy in the future if gross hematuria or other episodes of microscopic hematuria return.  His urinalysis was negative for red blood cells at his urology visit.  He is on testosterone  supplementation for hypogonadism, under the care of urology.  Last office visit in September.  Continued on 0.3 mL testosterone  twice weekly, 25-month follow-up with repeat labs for primary hypogonadism. No recurrence of hematuria.   Hypertension: Treated lisinopril  10 mg daily, has been evaluated by cardiology previously.  Option of aspirin given coronary artery calcification. No aspirin.  No  side effects with lisinopril .  Home readings: none BP Readings from Last 3 Encounters:  02/07/24 126/88  08/08/23 122/70  05/03/23 118/70   Lab Results  Component Value Date   CREATININE 1.05 08/08/2023   Wt Readings from Last 3 Encounters:  02/07/24 252 lb 6.4 oz (114.5 kg)  08/08/23 238 lb 12.8 oz (108.3 kg)  05/03/23 241 lb 9.6 oz (109.6 kg)   Hyperlipidemia: Crestor  10 mg daily.  Denies any new myalgias/arthralgias. Lab Results  Component Value Date   CHOL 134 08/08/2023   HDL 48.10 08/08/2023   LDLCALC 73 08/08/2023   TRIG 65.0 08/08/2023   CHOLHDL 3 08/08/2023   Lab Results  Component Value Date   ALT 21 08/08/2023   AST 25 08/08/2023   ALKPHOS 55 08/08/2023   BILITOT 0.4 08/08/2023   HM: Prevnar today  Declines covid booster.    History Patient Active Problem List   Diagnosis Date Noted   Obesity (BMI 30-39.9) 07/04/2022   Hypertension 02/16/2022   Coronary artery disease due to calcified coronary lesion 02/09/2022   Elevated blood pressure reading in office with white coat syndrome, without diagnosis of hypertension 02/09/2022   GAD (generalized anxiety disorder) 02/09/2022   Hepatitis C antibody positive in blood 02/09/2022   Low testosterone  in male 02/09/2022   Restless leg syndrome 02/09/2022   Football 02/06/2022   Personal history of multiple concussions 02/06/2022   Hyperlipidemia 01/01/2021   Degeneration of lumbar intervertebral disc 11/26/2020   Idiopathic peripheral neuropathy 02/12/2019   Bilateral plantar fasciitis 11/29/2018   Peripheral  neuropathy 07/04/2018   No past medical history on file. Past Surgical History:  Procedure Laterality Date   APPENDECTOMY     BACK SURGERY     x3   COLONOSCOPY  07/20/2014   Dr.Gillis    DG THUMB RIGHT HAND ([facility] HX)     index finger     KNEE ARTHROSCOPY W/ ACL RECONSTRUCTION     Allergies[1] Prior to Admission medications  Medication Sig Start Date End Date Taking? Authorizing Provider   cyanocobalamin (VITAMIN B12) 1000 MCG/ML injection Inject 1,000 mcg into the muscle every 30 (thirty) days. 01/22/24  Yes [provider]  lisinopril  (ZESTRIL ) 10 MG tablet Take 1 tablet (10 mg total) by mouth daily. 05/03/23  Yes Levora Reyes SAUNDERS, MD  Multiple Vitamin (MULTIVITAMIN) tablet Take 1 tablet by mouth daily.   Yes [provider]  nortriptyline  (PAMELOR ) 50 MG capsule Take 2 capsules (100 mg total) by mouth at bedtime. 01/18/24  Yes Levora Reyes SAUNDERS, MD  rosuvastatin  (CRESTOR ) 10 MG tablet Take 1 tablet (10 mg total) by mouth daily. 05/03/23  Yes Levora Reyes SAUNDERS, MD  testosterone  cypionate (DEPOTESTOSTERONE CYPIONATE) 200 MG/ML injection Inject 200 mg into the muscle 2 (two) times a week. 10/23/23  Yes [provider]   Social History   Socioeconomic History   Marital status: Married    Spouse name: Not on file   Number of children: Not on file   Years of education: Not on file   Highest education level: Bachelor's degree (e.g., BA, AB, BS)  Occupational History   Not on file  Tobacco Use   Smoking status: Never   Smokeless tobacco: Never  Vaping Use   Vaping status: Never Used  Substance and Sexual Activity   Alcohol use: Yes    Alcohol/week: 4.0 standard drinks of alcohol    Types: 4 Standard drinks or equivalent per week   Drug use: Not Currently   Sexual activity: Yes  Other Topics Concern   Not on file  Social History Narrative   ** Merged History Encounter **       Social Drivers of Health   Tobacco Use: Low Risk (08/09/2023)   Patient History    Smoking Tobacco Use: Never    Smokeless Tobacco Use: Never    Passive Exposure: Not on file  Financial Resource Strain: Low Risk (02/05/2024)   Overall Financial Resource Strain (CARDIA)    Difficulty of Paying Living Expenses: Not hard at all  Food Insecurity: No Food Insecurity (02/05/2024)   Epic    Worried About Programme Researcher, Broadcasting/film/video in the Last Year: Never true    Ran Out of Food in  the Last Year: Never true  Transportation Needs: No Transportation Needs (02/05/2024)   Epic    Lack of Transportation (Medical): No    Lack of Transportation (Non-Medical): No  Physical Activity: Insufficiently Active (02/05/2024)   Exercise Vital Sign    Days of Exercise per Week: 3 days    Minutes of Exercise per Session: 30 min  Stress: Stress Concern Present (02/05/2024)   [employer] of Occupational Health - Occupational Stress Questionnaire    Feeling of Stress: Very much  Social Connections: Socially Integrated (02/05/2024)   Social Connection and Isolation Panel    Frequency of Communication with Friends and Family: More than three times a week    Frequency of Social Gatherings with Friends and Family: Once a week    Attends Religious Services: 1 to 4 times per year  Active Member of Clubs or Organizations: Yes    Attends Banker Meetings: More than 4 times per year    Marital Status: Married  Catering Manager Violence: Not At Risk (03/02/2022)   Received from [facility]   HITS    Over the last 12 months how often did your partner physically hurt you?: Never    Over the last 12 months how often did your partner insult you or talk down to you?: Never    Over the last 12 months how often did your partner threaten you with physical harm?: Never    Over the last 12 months how often did your partner scream or curse at you?: Never  Depression (PHQ2-9): Medium Risk (02/07/2024)   Depression (PHQ2-9)    PHQ-2 Score: 5  Alcohol Screen: Low Risk (02/05/2024)   Alcohol Screen    Last Alcohol Screening Score (AUDIT): 3  Housing: Low Risk (02/05/2024)   Epic    Unable to Pay for Housing in the Last Year: No    Number of Times Moved in the Last Year: 0    Homeless in the Last Year: No  Utilities: Not At Risk (03/02/2022)   Received from [facility] Utilities    Threatened with loss of utilities: No  Health Literacy: Not on file    Review of Systems   Constitutional:  Negative for fatigue and unexpected weight change.  Eyes:  Negative for visual disturbance.  Respiratory:  Negative for cough, chest tightness and shortness of breath.   Cardiovascular:  Negative for chest pain, palpitations and leg swelling.  Gastrointestinal:  Negative for abdominal pain and blood in stool.  Neurological:  Negative for dizziness, light-headedness and headaches.     Objective:   Vitals:   02/07/24 1445  BP: 126/88  Pulse: 91  Resp: 15  Temp: 98.7 F (37.1 C)  TempSrc: Temporal  SpO2: 99%  Weight: 252 lb 6.4 oz (114.5 kg)  Height: 6' 5 (1.956 m)     Physical Exam Vitals reviewed.  Constitutional:      Appearance: He is well-developed.  HENT:     Head: Normocephalic and atraumatic.  Neck:     Vascular: No carotid bruit or JVD.  Cardiovascular:     Rate and Rhythm: Normal rate and regular rhythm.     Heart sounds: Normal heart sounds. No murmur heard. Pulmonary:     Effort: Pulmonary effort is normal.     Breath sounds: Normal breath sounds. No rales.  Musculoskeletal:     Right lower leg: No edema.     Left lower leg: No edema.  Skin:    General: Skin is warm and dry.  Neurological:     Mental Status: He is alert and oriented to person, place, and time.  Psychiatric:        Mood and Affect: Mood normal.        Assessment & Plan:  Philip Sherman is a 53 y.o. male . Small fiber neuropathy - Plan: nortriptyline  (PAMELOR ) 25 MG capsule, nortriptyline  (PAMELOR ) 50 MG capsule  - Similar symptoms, minimal limitation to activities as above.  Continue exercise, at previous advice of neuro will try slight higher dose of nortriptyline  at 125 mg nightly.  If that is tolerated yet no significant improvement could increase to 150 mg.  If he does have side effects which were discussed, return to 100 mg.  Insomnia, unspecified type - Plan: nortriptyline  (PAMELOR ) 25 MG capsule, nortriptyline  (PAMELOR ) 50  MG capsule  - Chronic  condition, minimal change with higher dose nortriptyline .  Will increase as above to 125 mg per night with potential side effects discussed.  Essential hypertension - Plan: Comprehensive metabolic panel with GFR, lisinopril  (ZESTRIL ) 10 MG tablet  - Stable current regimen, check labs and adjust plan accordingly  Hyperlipidemia, unspecified hyperlipidemia type - Plan: Comprehensive metabolic panel with GFR, Lipid panel, rosuvastatin  (CRESTOR ) 10 MG tablet  - Tolerating dose Crestor , check labs and adjust plan accordingly  Need for vaccination against Streptococcus pneumoniae - Plan: Pneumococcal conjugate vaccine 20-valent (Prevnar 20)  Screening for diabetes mellitus - Plan: Hemoglobin A1c   Meds ordered this encounter  Medications   nortriptyline  (PAMELOR ) 25 MG capsule    Sig: Take 1 capsule (25 mg total) by mouth at bedtime. Combine with 2 of 50mg  for total dose of 125 mg nightly.    Dispense:  30 capsule    Refill:  2   nortriptyline  (PAMELOR ) 50 MG capsule    Sig: Take 2 capsules (100 mg total) by mouth at bedtime.    Dispense:  180 capsule    Refill:  1   lisinopril  (ZESTRIL ) 10 MG tablet    Sig: Take 1 tablet (10 mg total) by mouth daily.    Dispense:  90 tablet    Refill:  2   rosuvastatin  (CRESTOR ) 10 MG tablet    Sig: Take 1 tablet (10 mg total) by mouth daily.    Dispense:  90 tablet    Refill:  2   Patient Instructions  Try adding additional 25 mg nortriptyline  at night to see if that helps with sleep and neuropathy symptoms.  If that is tolerated but no change in symptoms after 4 to 6 weeks, let me know and we can try to increase by an additional 25 mg or 150 mg total each night.  You can give me a call or send me a message through MyChart.  If you have any side effects of this higher dose return to the 100 mg dose.  Thank you for coming in today. No other change in medications at this time. If there are any concerns on your bloodwork, I will let you know. Take care  and Go [employer]!     Signed,   Reyes Pines, MD [facility], [facility] 02/07/2024 3:29 PM      [1] No Known Allergies

## 2024-02-07 NOTE — Patient Instructions (Signed)
 Try adding additional 25 mg nortriptyline  at night to see if that helps with sleep and neuropathy symptoms.  If that is tolerated but no change in symptoms after 4 to 6 weeks, let me know and we can try to increase by an additional 25 mg or 150 mg total each night.  You can give me a call or send me a message through MyChart.  If you have any side effects of this higher dose return to the 100 mg dose.  Thank you for coming in today. No other change in medications at this time. If there are any concerns on your bloodwork, I will let you know. Take care and Go [employer]!

## 2024-02-08 LAB — COMPREHENSIVE METABOLIC PANEL WITH GFR
ALT: 39 U/L (ref 0–53)
AST: 33 U/L (ref 0–37)
Albumin: 4.2 g/dL (ref 3.5–5.2)
Alkaline Phosphatase: 46 U/L (ref 39–117)
BUN: 23 mg/dL (ref 6–23)
CO2: 32 meq/L (ref 19–32)
Calcium: 9.2 mg/dL (ref 8.4–10.5)
Chloride: 101 meq/L (ref 96–112)
Creatinine, Ser: 1.1 mg/dL (ref 0.40–1.50)
GFR: 76.05 mL/min (ref >60.00)
Glucose, Bld: 80 mg/dL (ref 70–99)
Potassium: 4.5 meq/L (ref 3.5–5.1)
Sodium: 139 meq/L (ref 135–145)
Total Bilirubin: 0.5 mg/dL (ref 0.2–1.2)
Total Protein: 6.9 g/dL (ref 6.0–8.3)

## 2024-02-08 LAB — LIPID PANEL
Cholesterol: 140 mg/dL (ref 0–200)
HDL: 44.5 mg/dL (ref >39.00)
LDL Cholesterol: 58 mg/dL (ref 0–99)
NonHDL: 95.6
Total CHOL/HDL Ratio: 3
Triglycerides: 188 mg/dL — ABNORMAL HIGH (ref 0.0–149.0)
VLDL: 37.6 mg/dL (ref 0.0–40.0)

## 2024-02-08 LAB — HEMOGLOBIN A1C: Hgb A1c MFr Bld: 5.5 % (ref 4.6–6.5)

## 2024-02-09 ENCOUNTER — Encounter: Payer: Self-pay | Admitting: Family Medicine

## 2024-02-13 ENCOUNTER — Ambulatory Visit: Payer: Self-pay | Admitting: Family Medicine

## 2024-08-07 ENCOUNTER — Encounter: Admitting: Family Medicine
